# Patient Record
Sex: Female | Born: 1985 | Race: Black or African American | Hispanic: No | Marital: Single | Smoking: Never smoker
Health system: Southern US, Community
[De-identification: ages and names within clinical notes are randomized; demographics above are authoritative.]

## PROBLEM LIST (undated history)

## (undated) DIAGNOSIS — N6310 Unspecified lump in the right breast, unspecified quadrant: Secondary | ICD-10-CM

## (undated) DIAGNOSIS — B009 Herpesviral infection, unspecified: Secondary | ICD-10-CM

## (undated) DIAGNOSIS — Z800 Family history of malignant neoplasm of digestive organs: Secondary | ICD-10-CM

## (undated) DIAGNOSIS — Z8042 Family history of malignant neoplasm of prostate: Secondary | ICD-10-CM

## (undated) DIAGNOSIS — Z808 Family history of malignant neoplasm of other organs or systems: Secondary | ICD-10-CM

## (undated) DIAGNOSIS — D219 Benign neoplasm of connective and other soft tissue, unspecified: Secondary | ICD-10-CM

## (undated) DIAGNOSIS — K219 Gastro-esophageal reflux disease without esophagitis: Secondary | ICD-10-CM

## (undated) DIAGNOSIS — Z803 Family history of malignant neoplasm of breast: Secondary | ICD-10-CM

## (undated) HISTORY — DX: Family history of malignant neoplasm of digestive organs: Z80.0

## (undated) HISTORY — DX: Family history of malignant neoplasm of breast: Z80.3

## (undated) HISTORY — DX: Family history of malignant neoplasm of prostate: Z80.42

## (undated) HISTORY — DX: Benign neoplasm of connective and other soft tissue, unspecified: D21.9

## (undated) HISTORY — DX: Herpesviral infection, unspecified: B00.9

## (undated) HISTORY — PX: NO PAST SURGERIES: SHX2092

## (undated) HISTORY — DX: Family history of malignant neoplasm of other organs or systems: Z80.8

---

## 2012-01-26 DIAGNOSIS — R112 Nausea with vomiting, unspecified: Secondary | ICD-10-CM | POA: Insufficient documentation

## 2012-01-26 DIAGNOSIS — R109 Unspecified abdominal pain: Secondary | ICD-10-CM | POA: Insufficient documentation

## 2012-01-27 ENCOUNTER — Encounter (HOSPITAL_COMMUNITY): Payer: Self-pay | Admitting: *Deleted

## 2012-01-27 ENCOUNTER — Emergency Department (HOSPITAL_COMMUNITY)
Admission: EM | Admit: 2012-01-27 | Discharge: 2012-01-27 | Disposition: A | Discharge status: Home / Self Care | Attending: Emergency Medicine | Admitting: Emergency Medicine

## 2012-01-27 DIAGNOSIS — R112 Nausea with vomiting, unspecified: Secondary | ICD-10-CM

## 2012-01-27 LAB — URINALYSIS, ROUTINE W REFLEX MICROSCOPIC
Bilirubin Urine: NEGATIVE
Glucose, UA: NEGATIVE mg/dL
Hgb urine dipstick: NEGATIVE
Ketones, ur: NEGATIVE mg/dL
Protein, ur: NEGATIVE mg/dL

## 2012-01-27 MED ORDER — KETOROLAC TROMETHAMINE 60 MG/2ML IM SOLN: 60.0000 mg | Freq: Once | INTRAMUSCULAR | Status: DC

## 2012-01-27 MED ORDER — PROMETHAZINE HCL 25 MG PO TABS: 25.0000 mg | ORAL_TABLET | Freq: Four times a day (QID) | ORAL | Status: DC | PRN

## 2012-01-27 MED ORDER — IBUPROFEN 800 MG PO TABS
800.0000 mg | ORAL_TABLET | Freq: Once | ORAL | Status: AC
  Administered 2012-01-27: 800 mg via ORAL
  Filled 2012-01-27: qty 1

## 2012-01-27 MED ORDER — ONDANSETRON 4 MG PO TBDP
8.0000 mg | ORAL_TABLET | Freq: Once | ORAL | Status: AC
  Administered 2012-01-27: 8 mg via ORAL
  Filled 2012-01-27: qty 2

## 2012-01-27 MED ORDER — ONDANSETRON 4 MG PO TBDP: 4.0000 mg | ORAL_TABLET | Freq: Three times a day (TID) | ORAL | Status: DC | PRN

## 2012-01-27 NOTE — ED Notes (Signed)
C/o nv & abd cramping, onset ~ 1.5 hrs ago, onset after eating at a restaurant ([employer] [place] of [employer] [address]). No meds PTA. Denies sick contacts or recent illness. Denies other sx.

## 2012-01-27 NOTE — ED Notes (Signed)
Pt starting to feel better, pt denies wanting anything to eat or drink.

## 2012-01-27 NOTE — ED Provider Notes (Signed)
History     CSN: [id number]  Arrival date & time 01/26/12  2330   First MD Initiated Contact with Patient 01/27/12 0020      Chief Complaint  Patient presents with  . Abdominal Cramping  . Emesis    (Consider location/radiation/quality/duration/timing/severity/associated sxs/prior treatment) HPI Comments: 26 year old female who states that approximately 2 hours ago she developed acute onset of nausea vomiting and abdominal cramping. This is persistent, mild to moderate, not associated with fevers chills coughing shortness of breath dysuria diarrhea and states that she is not pregnant. This occurred approximately 2 hours after eating at a restaurant where she had a questionable meal. She states that the chicken Parmesan that she ate was not well cooked, lukewarm, and did not taste right.  Patient is a 26 y.o. female presenting with cramps and vomiting. The history is provided by the patient.  Abdominal Cramping The primary symptoms of the illness include abdominal pain, nausea and vomiting. The primary symptoms of the illness do not include fever or diarrhea.  Symptoms associated with the illness do not include chills.  Emesis  Associated symptoms include abdominal pain. Pertinent negatives include no chills, no diarrhea and no fever.    History reviewed. No pertinent past medical history.  History reviewed. No pertinent past surgical history.  No family history on file.  History  Substance Use Topics  . Smoking status: Never Smoker   . Smokeless tobacco: Not on file  . Alcohol Use: Yes    OB History    Grav Para Term Preterm Abortions TAB SAB Ect Mult Living                  Review of Systems  Constitutional: Negative for fever and chills.  Gastrointestinal: Positive for nausea, vomiting and abdominal pain. Negative for diarrhea.  Skin: Negative for rash.    Allergies  Review of patient's allergies indicates no known allergies.  Home Medications   Current  Outpatient Rx  Name Route Sig Dispense Refill  . ADULT MULTIVITAMIN W/MINERALS CH Oral Take 1 tablet by mouth daily.    Marland Kitchen ONDANSETRON 4 MG PO TBDP Oral Take 1 tablet (4 mg total) by mouth every 8 (eight) hours as needed for nausea. 10 tablet 0  . PROMETHAZINE HCL 25 MG PO TABS Oral Take 1 tablet (25 mg total) by mouth every 6 (six) hours as needed for nausea. 12 tablet 0    BP 118/70  Pulse 73  Temp 98.2 F (36.8 C) (Oral)  Resp 18  SpO2 99%  LMP 01/18/2012  Physical Exam  Nursing note and vitals reviewed. HENT:  Head: Normocephalic and atraumatic.  Mouth/Throat: Oropharynx is clear and moist.  Eyes: Conjunctivae normal are normal. No scleral icterus.  Cardiovascular: Normal rate, regular rhythm and normal heart sounds.   Pulmonary/Chest: Effort normal and breath sounds normal. No respiratory distress. She has no wheezes. She has no rales.  Abdominal: Soft. She exhibits no distension. There is no tenderness. There is no rebound.       Increased bowel sounds  Neurological: She is alert. Coordination normal.  Skin: Skin is warm and dry. No rash noted. No erythema.    ED Course  Procedures (including critical care time)  Labs Reviewed  URINALYSIS, ROUTINE W REFLEX MICROSCOPIC - Abnormal; Notable for the following:    Specific Gravity, Urine 1.004 (*)     All other components within normal limits  PREGNANCY, URINE   No results found.   1. Nausea and vomiting  MDM  Well-appearing, mild nausea and vomiting, will require Zofran, anti-inflammatory, rule out pregnancy and urinary infection. Possibly food borne illness, supportive care  Improved with Zofran, stable for discharge. Home with Phenergan and Zofran when necessary    Urinalysis negative for infection or pregnancy  Vida Roller, MD 01/27/12 0131

## 2014-01-28 ENCOUNTER — Other Ambulatory Visit: Payer: Self-pay | Admitting: Gynecology

## 2014-01-28 ENCOUNTER — Ambulatory Visit (INDEPENDENT_AMBULATORY_CARE_PROVIDER_SITE_OTHER)

## 2014-01-28 ENCOUNTER — Encounter: Payer: Self-pay | Admitting: Gynecology

## 2014-01-28 ENCOUNTER — Ambulatory Visit (INDEPENDENT_AMBULATORY_CARE_PROVIDER_SITE_OTHER): Admitting: Gynecology

## 2014-01-28 ENCOUNTER — Telehealth: Payer: Self-pay | Admitting: Gynecology

## 2014-01-28 VITALS — BP 124/80 | Ht 68.0 in | Wt 165.0 lb

## 2014-01-28 DIAGNOSIS — N83202 Unspecified ovarian cyst, left side: Secondary | ICD-10-CM

## 2014-01-28 DIAGNOSIS — R102 Pelvic and perineal pain: Secondary | ICD-10-CM

## 2014-01-28 DIAGNOSIS — N920 Excessive and frequent menstruation with regular cycle: Secondary | ICD-10-CM | POA: Insufficient documentation

## 2014-01-28 DIAGNOSIS — N831 Corpus luteum cyst of ovary, unspecified side: Secondary | ICD-10-CM

## 2014-01-28 DIAGNOSIS — N941 Dyspareunia: Secondary | ICD-10-CM

## 2014-01-28 DIAGNOSIS — N8320 Unspecified ovarian cysts: Secondary | ICD-10-CM

## 2014-01-28 DIAGNOSIS — Z30014 Encounter for initial prescription of intrauterine contraceptive device: Secondary | ICD-10-CM

## 2014-01-28 DIAGNOSIS — N946 Dysmenorrhea, unspecified: Secondary | ICD-10-CM

## 2014-01-28 DIAGNOSIS — IMO0002 Reserved for concepts with insufficient information to code with codable children: Secondary | ICD-10-CM

## 2014-01-28 MED ORDER — MEDROXYPROGESTERONE ACETATE 150 MG/ML IM SUSP
150.0000 mg | Freq: Once | INTRAMUSCULAR | Status: AC
  Administered 2014-01-28: 150 mg via INTRAMUSCULAR

## 2014-01-28 MED ORDER — DIAZEPAM 5 MG PO TABS: ORAL_TABLET | ORAL | Status: DC

## 2014-01-28 NOTE — Progress Notes (Addendum)
   28 year old gravida 1 para 0 AB 1 (one elective termination) presented to the office today to discuss intrauterine device for contraception cycle control. Patient suffers from heavy periods with passage of large clots although she menstruates every 28 days and last for approximately 5-7 days. She has tried numerous oral contraceptive pills as well as the NuvaRing Depo-Provera injection and is interested in the [place] IUD. Patient also has been complaining of dyspareunia. No postcoital bleeding reported. Patient stated that she had chlamydia infection which she was 28 years of age. She had a recent complete gynecological exam at [facility] 2 weeks ago and had a normal Pap smear. Patient is currently on day 2 of her menstrual cycle.  Exam: Abdomen: Soft nontender no rebound guarding Pelvic exam: And urethra Skene glands: Within normal limits Vagina: Menstrual blood present Cervix: Menstrual blood Bimanual exam difficult due to vaginismus Vaginal rectal exam: Not done as per above  We are in the process of doing an ultrasound as part of her evaluation for dyspareunia but patient had to leave.  Assessment/plan: #1 dysmenorrhea #2 dyspareunia #3 dysmenorrhea #4 information on the [facility] IUD pros and cons were discussed. She will return back to the office next week for placement of a Mirena IUD. I have given her prescription of Valium 5 mg to take the evening before placement of the [facility] IUD as well as 2 hours before the procedure the following day. I've asked also taken Motrin as well. Patient flu vaccine is up to date as well. We will schedule an ultrasound a month after placement of the [facility] IUD as well as to assess her ovaries as well. Literature information was provided.   Patient returned back in the afternoon so the ultrasound was performed results as follows: Uterus measured 8.5 x 5.3 x 3.8 cm with endometrial stripe of 10.1 mm. Anteverted uterus with normal right ovary. Left ovary  with a thin wall cyst measuring 3.1 x 2.4 x 2 20 mm with positive color flow in the periphery. No fluid in the cul-de-sac.  Because of cost she is going to hold off on the [place] IUD. She will be given a shot of Depo-Provera 150 mg IM today and she will return back in 3 months for a followup ultrasound and then reschedule the [facility]  IUD for later date. A CA 125 was done today. Patient fully aware of its limitations.

## 2014-01-28 NOTE — Patient Instructions (Signed)
Levonorgestrel intrauterine device (IUD) What is this medicine? LEVONORGESTREL IUD (LEE voe nor jes trel) is a contraceptive (birth control) device. The device is placed inside the uterus by a healthcare professional. It is used to prevent pregnancy and can also be used to treat heavy bleeding that occurs during your period. Depending on the device, it can be used for 3 to 5 years. This medicine may be used for other purposes; ask your health care provider or pharmacist if you have questions. COMMON BRAND NAME(S): LILETTA, Mirena, Skyla What should I tell my health care provider before I take this medicine? They need to know if you have any of these conditions: -abnormal Pap smear -cancer of the breast, uterus, or cervix -diabetes -endometritis -genital or pelvic infection now or in the past -have more than one sexual partner or your partner has more than one partner -heart disease -history of an ectopic or tubal pregnancy -immune system problems -IUD in place -liver disease or tumor -problems with blood clots or take blood-thinners -use intravenous drugs -uterus of unusual shape -vaginal bleeding that has not been explained -an unusual or allergic reaction to levonorgestrel, other hormones, silicone, or polyethylene, medicines, foods, dyes, or preservatives -pregnant or trying to get pregnant -breast-feeding How should I use this medicine? This device is placed inside the uterus by a health care professional. Talk to your pediatrician regarding the use of this medicine in children. Special care may be needed. Overdosage: If you think you have taken too much of this medicine contact a poison control center or emergency room at once. NOTE: This medicine is only for you. Do not share this medicine with others. What if I miss a dose? This does not apply. What may interact with this medicine? Do not take this medicine with any of the following  medications: -amprenavir -bosentan -fosamprenavir This medicine may also interact with the following medications: -aprepitant -barbiturate medicines for inducing sleep or treating seizures -bexarotene -griseofulvin -medicines to treat seizures like carbamazepine, ethotoin, felbamate, oxcarbazepine, phenytoin, topiramate -modafinil -pioglitazone -rifabutin -rifampin -rifapentine -some medicines to treat HIV infection like atazanavir, indinavir, lopinavir, nelfinavir, tipranavir, ritonavir -St. John's wort -warfarin This list may not describe all possible interactions. Give your health care provider a list of all the medicines, herbs, non-prescription drugs, or dietary supplements you use. Also tell them if you smoke, drink alcohol, or use illegal drugs. Some items may interact with your medicine. What should I watch for while using this medicine? Visit your doctor or health care professional for regular check ups. See your doctor if you or your partner has sexual contact with others, becomes HIV positive, or gets a sexual transmitted disease. This product does not protect you against HIV infection (AIDS) or other sexually transmitted diseases. You can check the placement of the IUD yourself by reaching up to the top of your vagina with clean fingers to feel the threads. Do not pull on the threads. It is a good habit to check placement after each menstrual period. Call your doctor right away if you feel more of the IUD than just the threads or if you cannot feel the threads at all. The IUD may come out by itself. You may become pregnant if the device comes out. If you notice that the IUD has come out use a backup birth control method like condoms and call your health care provider. Using tampons will not change the position of the IUD and are okay to use during your period. What side effects may   I notice from receiving this medicine? Side effects that you should report to your doctor or  health care professional as soon as possible: -allergic reactions like skin rash, itching or hives, swelling of the face, lips, or tongue -fever, flu-like symptoms -genital sores -high blood pressure -no menstrual period for 6 weeks during use -pain, swelling, warmth in the leg -pelvic pain or tenderness -severe or sudden headache -signs of pregnancy -stomach cramping -sudden shortness of breath -trouble with balance, talking, or walking -unusual vaginal bleeding, discharge -yellowing of the eyes or skin Side effects that usually do not require medical attention (report to your doctor or health care professional if they continue or are bothersome): -acne -breast pain -change in sex drive or performance -changes in weight -cramping, dizziness, or faintness while the device is being inserted -headache -irregular menstrual bleeding within first 3 to 6 months of use -nausea This list may not describe all possible side effects. Call your doctor for medical advice about side effects. You may report side effects to FDA at 1-800-FDA-1088. Where should I keep my medicine? This does not apply. NOTE: This sheet is a summary. It may not cover all possible information. If you have questions about this medicine, talk to your doctor, pharmacist, or health care provider.  2015, Elsevier/Gold Standard. (2011-05-11 13:54:04)  

## 2014-01-28 NOTE — Addendum Note (Signed)
Addended by: Thurnell Garbe A on: 01/28/2014 04:54 PM   Modules accepted: Orders

## 2014-01-28 NOTE — Telephone Encounter (Signed)
01/28/14-PT HAS [employer] AND BECAUSE WE ARE NOT IN NETWORK  WITH THEM THE PATIENT WAS INFORMED THAT SHE HAS A 20% COINSURANCE ON THE MIRENA AND INSERTION OF $[phone].WL

## 2014-01-28 NOTE — Addendum Note (Signed)
Addended by: Terrance Mass on: 01/28/2014 04:04 PM   Modules accepted: Orders

## 2014-01-29 LAB — CA 125: CA 125: 21 U/mL (ref <35)

## 2014-02-23 ENCOUNTER — Encounter: Payer: Self-pay | Admitting: Gynecology

## 2014-03-09 ENCOUNTER — Ambulatory Visit: Admitting: Gynecology

## 2014-03-27 ENCOUNTER — Telehealth: Payer: Self-pay | Admitting: *Deleted

## 2014-03-27 NOTE — Telephone Encounter (Signed)
(  pt aware you are out of the office) Pt calling to follow up from [place] 01/28/2014 received depo provera shot c/o spotting everyday since having shot. Pt said bleeding is not heavy wearing tampon changing ever 5 hour, more nerve racking than anything. Pt asked if you could give her something to stop the spotting? Please advise

## 2014-03-29 ENCOUNTER — Other Ambulatory Visit: Payer: Self-pay | Admitting: Gynecology

## 2014-03-29 NOTE — Telephone Encounter (Signed)
Please call in prescription for Estrace 0.5 mg po q daily for 25 days call in 25 tablets. Also she can take Motrin 800mg  TID with foods for five days as well. Make sure she does a home UPT as well to make sure she is not one of the 1 % that can get pregnant on Depoprovera.

## 2014-03-30 MED ORDER — ESTRADIOL 0.5 MG PO TABS: 0.5000 mg | ORAL_TABLET | Freq: Every day | ORAL | Status: DC

## 2014-03-30 MED ORDER — IBUPROFEN 800 MG PO TABS
ORAL_TABLET | ORAL | Status: DC
Start: 2014-03-30 — End: 2017-04-10

## 2014-03-30 NOTE — Telephone Encounter (Signed)
Pt informed with the below note, Rx sent. 

## 2014-04-22 ENCOUNTER — Ambulatory Visit (INDEPENDENT_AMBULATORY_CARE_PROVIDER_SITE_OTHER): Admitting: Gynecology

## 2014-04-22 ENCOUNTER — Encounter: Payer: Self-pay | Admitting: Gynecology

## 2014-04-22 VITALS — BP 118/74

## 2014-04-22 DIAGNOSIS — N83202 Unspecified ovarian cyst, left side: Secondary | ICD-10-CM

## 2014-04-22 DIAGNOSIS — N938 Other specified abnormal uterine and vaginal bleeding: Secondary | ICD-10-CM

## 2014-04-22 DIAGNOSIS — N8320 Unspecified ovarian cysts: Secondary | ICD-10-CM

## 2014-04-22 MED ORDER — NORETHINDRONE-ETH ESTRADIOL 1-35 MG-MCG PO TABS: ORAL_TABLET | ORAL | Status: DC

## 2014-04-22 NOTE — Progress Notes (Signed)
   Patient presented to the office today for planned placement of Mirena IUD. Patient now has had a change of heart and would like to go back on the oral contraceptive pill like she had been in the past in which she had regular cycles. On the last office visit of October 7 because of her dyspareunia and dysmenorrhea we proceeded with doing an ultrasound and the following was noted:  Uterus measured 8.5 x 5.3 x 3.8 cm with endometrial stripe of 10.1 mm. Anteverted uterus with normal right ovary. Left ovary with a thin wall cyst measuring 3.1 x 2.4 x 2 20 mm with positive color flow in the periphery. No fluid in the cul-de-sac.   A CA-125 was in the normal range and she was given a shot of Depo-Provera 150 mg IM and is scheduled to return back next month for follow-up ultrasound on the ovarian cyst. She states that since she had the shot she's been bleeding just about every day.  We are going to prescribe her Ortho-Novum 1/35 her to take continuously and withdrawal every 3 months. We will see her next month for follow-up ultrasound. The risks benefits and pros and cons of oral contraceptive pills were discussed.

## 2014-06-17 ENCOUNTER — Telehealth: Payer: Self-pay | Admitting: *Deleted

## 2014-06-17 DIAGNOSIS — N83202 Unspecified ovarian cyst, left side: Secondary | ICD-10-CM

## 2014-06-17 NOTE — Telephone Encounter (Signed)
-----   Message from Sinclair Grooms sent at 06/17/2014 12:16 PM EST ----- Regarding: [place] order "We will see her next month for follow-up ultrasound." per JF  note dated 04-22-14. Please place order. Thx

## 2014-06-17 NOTE — Telephone Encounter (Signed)
Order placed

## 2014-07-03 ENCOUNTER — Other Ambulatory Visit

## 2014-07-03 ENCOUNTER — Ambulatory Visit: Admitting: Gynecology

## 2014-07-10 ENCOUNTER — Ambulatory Visit (INDEPENDENT_AMBULATORY_CARE_PROVIDER_SITE_OTHER)

## 2014-07-10 ENCOUNTER — Encounter: Payer: Self-pay | Admitting: Gynecology

## 2014-07-10 ENCOUNTER — Other Ambulatory Visit: Payer: Self-pay | Admitting: Gynecology

## 2014-07-10 ENCOUNTER — Ambulatory Visit (INDEPENDENT_AMBULATORY_CARE_PROVIDER_SITE_OTHER): Admitting: Gynecology

## 2014-07-10 DIAGNOSIS — Z8041 Family history of malignant neoplasm of ovary: Secondary | ICD-10-CM

## 2014-07-10 DIAGNOSIS — N946 Dysmenorrhea, unspecified: Secondary | ICD-10-CM | POA: Diagnosis not present

## 2014-07-10 DIAGNOSIS — N83202 Unspecified ovarian cyst, left side: Secondary | ICD-10-CM

## 2014-07-10 DIAGNOSIS — Z803 Family history of malignant neoplasm of breast: Secondary | ICD-10-CM | POA: Diagnosis not present

## 2014-07-10 DIAGNOSIS — N8320 Unspecified ovarian cysts: Secondary | ICD-10-CM | POA: Diagnosis not present

## 2014-07-10 DIAGNOSIS — Z800 Family history of malignant neoplasm of digestive organs: Secondary | ICD-10-CM | POA: Insufficient documentation

## 2014-07-10 NOTE — Progress Notes (Signed)
    Patient is a 29 year old who was seen last in the office on 04/22/2014 who had been scheduled originally to plan in place a Mirena IUD.Patient now has had a change of heart and would like to go back on the oral contraceptive pill like she had been in the past in which she had regular cycles. On the last office visit of October 7 because of her dyspareunia and dysmenorrhea we proceeded with doing an ultrasound and the following was noted:  Uterus measured 8.5 x 5.3 x 3.8 cm with endometrial stripe of 10.1 mm. Anteverted uterus with normal right ovary. Left ovary with a thin wall cyst measuring 3.1 x 2.4 x 2 20 mm with positive color flow in the periphery. No fluid in the cul-de-sac.   A CA-125 was in the normal range and she was given a shot of Depo-Provera 150 mg IM and is scheduled to return back next month for follow-up ultrasound on the ovarian cyst. Her CA-125 was normal. She recently started her oral contraceptive pills and is now having normal menstrual cycles. She is here for follow-up to discuss the ultrasound.  Ultrasound today: Uterus measures 7.4 x 5.1 x 3.9 cm with endometrial stripe of 9.8 mm. Right and left ovary were normal. No fluid in the cul-de-sac and no apparent adnexal masses  Patient has informed me of significant number of relatives with cancers as follows:: Mother with breast cancer and age 54 Father with colon cancer at the age of 69 Maternal grandmother with lung cancer Maternal aunt with breast cancer Maternal cousin with ovarian cancer  Patient will be referred to the [facility] with the geneticist to discuss further cancer screening with genetic markers. I have asked her to discuss with her family members to get more detail in the event that other family members have other forms of cancer and to bring that information with her to the consultation as a prepared to do a pedigree a family history of various cancers for consideration of genetic  testing. She was reminded to schedule her annual exam the next 2 months.

## 2014-07-10 NOTE — Patient Instructions (Signed)
BRCA-1 and BRCA-2 BRCA-1 and BRCA-2 are 2 genes that are linked with hereditary breast and ovarian cancers. About 200,000 women are diagnosed with invasive breast cancer each year and about 23,000 with ovarian cancer (according to the American Cancer Society). Of these cancers, about 5% to 10% will be due to a mutation in one of the BRCA genes. Men can also inherit an increased risk of developing breast cancer, primarily from an alteration in the BRCA-2 gene.  Individuals with mutations in BRCA1 or BRCA2 have significantly elevated risks for breast cancer (up to 80% lifetime risk), ovarian cancer (up to 40% lifetime risk), bilateral breast cancer and other types of cancers. BRCA mutations are inherited and passed from generation to generation. One half of the time, they are passed from the father's side of the family.  The DNA in white blood cells is used to detect mutations in the BRCA genes. While the gene products (proteins) of the BRCA genes act only in breast and ovarian tissue, the genes are present in every cell of the body and blood is the most easily accessible source of that DNA. PREPARATION FOR TEST The test for BRCA mutations is done on a blood sample collected by needle from a vein in the arm. The test does not require surgical biopsy of breast or ovarian tissue.  NORMAL FINDINGS No genetic mutations. Ranges for normal findings may vary among different laboratories and hospitals. You should always check with your doctor after having lab work or other tests done to discuss the meaning of your test results and whether your values are considered within normal limits. MEANING OF TEST  Your caregiver will go over the test results with you and discuss the importance and meaning of your results, as well as treatment options and the need for additional tests if necessary. OBTAINING THE TEST RESULTS It is your responsibility to obtain your test results. Ask the lab or department performing the test  when and how you will get your results. OTHER THINGS TO KNOW Your test results may have implications for other family members. When one member of a family is tested for BRCA mutations, issues often arise about how or whether to share this information with other family members. Seek advice from a genetic counselor about communication of result with your family members.  Pre and post test consultation with a health care provider knowledgeable about genetic testing cannot be overemphasized.  There are many issues to be considered when preparing for a genetic test and upon learning the results, and a genetic counselor has the knowledge and experience to help you sort through them.  If the BRCA test is positive, the options include increased frequency of check-ups (e.g., mammography, blood tests for CA-125, or transvaginal ultrasonography); medications that could reduce risk (e.g., oral contraceptives or tamoxifen); or surgical removal of the ovaries or breasts. There are a number of variables involved and it is important to discuss your options with your doctor and genetic counselor. Research studies have reported that for every 1000 women negative for BRCA mutations, between 12 and 45 of them will develop breast cancer by age 50 and between 3 and 4 will develop ovarian cancer by age 50. The risk increases with age. The test can be ordered by a doctor, preferably by one who can also offer genetic counseling. The blood sample will be sent to a laboratory that specializes in BRCA testing. The American Society of Clinical Oncology and the National Breast Cancer Coalition encourage women seeking the   test to participate in long-term outcome studies to help gather information on the effectiveness of different check-up and treatment options. Document Released: 05/04/2004 Document Revised: 07/03/2011 Document Reviewed: 07/11/2013 [employer] Patient Information 2015 [place], [place]. This information is not intended to  replace advice given to you by your health care provider. Make sure you discuss any questions you have with your health care provider.

## 2014-07-13 ENCOUNTER — Telehealth: Payer: Self-pay | Admitting: *Deleted

## 2014-07-13 ENCOUNTER — Telehealth: Payer: Self-pay | Admitting: Genetic Counselor

## 2014-07-13 NOTE — Telephone Encounter (Signed)
Called pt and scheduled new pt gen couseling appt.   Cheryl [name] 08/03/14  2 pm Dx: Family History of Breast Cancer Referring: Dr. Toney Rakes

## 2014-07-13 NOTE — Telephone Encounter (Signed)
-----   Message from Terrance Mass, MD sent at 07/10/2014  3:10 PM EDT ----- Anderson Malta, please schedule appointment for this patient with geneticist at the [facility] [place].  Patient will be referred to the [facility] with the geneticist to discuss further cancer screening with genetic markers. I have asked her to discuss with her family members to get more detail in the event that other family members have other forms of cancer and to bring that information with her to the consultation as a prepared to do a pedigree a family history of various cancers for consideration of genetic testing.   Mother with history of breast and ovarian cancer age 41 Father history of colon cancer at the age of 53 Maternal grandmother with history of lung cancer Maternal aunts with history of breast cancer Maternal cousin with history of ovarian cancer

## 2014-07-13 NOTE — Telephone Encounter (Signed)
Referral faxed to [facility] they will contact pt to schedule.

## 2014-07-16 NOTE — Telephone Encounter (Signed)
Appointment 08/03/14 @ 4:00pm

## 2014-08-03 ENCOUNTER — Encounter: Admitting: Genetic Counselor

## 2014-08-03 ENCOUNTER — Other Ambulatory Visit

## 2014-09-13 ENCOUNTER — Emergency Department (HOSPITAL_BASED_OUTPATIENT_CLINIC_OR_DEPARTMENT_OTHER)
Admission: EM | Admit: 2014-09-13 | Discharge: 2014-09-13 | Disposition: A | Discharge status: Home / Self Care | Attending: Emergency Medicine | Admitting: Emergency Medicine

## 2014-09-13 ENCOUNTER — Encounter (HOSPITAL_BASED_OUTPATIENT_CLINIC_OR_DEPARTMENT_OTHER): Payer: Self-pay | Admitting: *Deleted

## 2014-09-13 ENCOUNTER — Emergency Department (HOSPITAL_BASED_OUTPATIENT_CLINIC_OR_DEPARTMENT_OTHER)

## 2014-09-13 DIAGNOSIS — Z8619 Personal history of other infectious and parasitic diseases: Secondary | ICD-10-CM | POA: Diagnosis not present

## 2014-09-13 DIAGNOSIS — Y9367 Activity, basketball: Secondary | ICD-10-CM | POA: Insufficient documentation

## 2014-09-13 DIAGNOSIS — X58XXXA Exposure to other specified factors, initial encounter: Secondary | ICD-10-CM | POA: Insufficient documentation

## 2014-09-13 DIAGNOSIS — S93402A Sprain of unspecified ligament of left ankle, initial encounter: Secondary | ICD-10-CM | POA: Insufficient documentation

## 2014-09-13 DIAGNOSIS — Y9289 Other specified places as the place of occurrence of the external cause: Secondary | ICD-10-CM | POA: Diagnosis not present

## 2014-09-13 DIAGNOSIS — Y998 Other external cause status: Secondary | ICD-10-CM | POA: Diagnosis not present

## 2014-09-13 DIAGNOSIS — S99912A Unspecified injury of left ankle, initial encounter: Secondary | ICD-10-CM | POA: Diagnosis present

## 2014-09-13 DIAGNOSIS — Z79899 Other long term (current) drug therapy: Secondary | ICD-10-CM | POA: Insufficient documentation

## 2014-09-13 MED ORDER — METHOCARBAMOL 500 MG PO TABS: 1000.0000 mg | ORAL_TABLET | Freq: Four times a day (QID) | ORAL | Status: DC | PRN

## 2014-09-13 NOTE — ED Provider Notes (Signed)
CSN: [id number]     Arrival date & time 09/13/14  1812 History   First MD Initiated Contact with Patient 09/13/14 1828     Chief Complaint  Patient presents with  . Ankle Injury     (Consider location/radiation/quality/duration/timing/severity/associated sxs/prior Treatment) HPI   Cheryl [name] is a 29 y.o. female complaining of Left ankle pain after patient rolled ankle while playing basketball prior to arrival. Patient is unable to weight-bear on the ankle. She reports initially her pain was about 8 out of 10, since resting it, icing it and taking 3 over-the-counter Advil pills for pain is now 3 out of 10. Patient denies numbness, weakness, laceration, history of trauma or surgery to the affected joint. No other trauma in the incident.  Past Medical History  Diagnosis Date  . HSV-2 infection    History reviewed. No pertinent past surgical history. Family History  Problem Relation Age of Onset  . Adopted: Yes  . Breast cancer Mother   . Ovarian cancer Mother   . Cancer Maternal Aunt     brain   . Cancer - Lung Maternal Grandmother     lung  . Cancer - Ovarian Cousin     ovarian-maternal  . Cancer - Colon Father    History  Substance Use Topics  . Smoking status: Never Smoker   . Smokeless tobacco: Never Used  . Alcohol Use: No   OB History    Gravida Para Term Preterm AB TAB SAB Ectopic Multiple Living   1 0 0 0 1 1 0 0 0 0      Review of Systems  10 systems reviewed and found to be negative, except as noted in the HPI.   Allergies  Review of patient's allergies indicates no known allergies.  Home Medications   Prior to Admission medications   Medication Sig Start Date End Date Taking? Authorizing Provider  ibuprofen (ADVIL,MOTRIN) 800 MG tablet Take one tablet three times a day with food for five days PRN  For pain. 03/30/14  Yes Terrance Mass, MD  Multiple Vitamin (MULTIVITAMIN WITH MINERALS) TABS Take 1 tablet by mouth daily.   Yes Historical Provider,  MD  NASAL SPRAY SALINE NA Place into the nose.   Yes Historical Provider, MD  norethindrone-ethinyl estradiol 1/35 (Prue 1/35, 28,) tablet Take one daily as directed and withdraw every three months. 04/22/14  Yes Terrance Mass, MD  valACYclovir (VALTREX) 500 MG tablet Take 500 mg by mouth 2 (two) times daily.   Yes Historical Provider, MD  diazepam (VALIUM) 5 MG tablet Take one night before procedure and one 2 hours before procedure 01/28/14   Terrance Mass, MD  methocarbamol (ROBAXIN) 500 MG tablet Take 2 tablets (1,000 mg total) by mouth 4 (four) times daily as needed (Pain). 09/13/14   Logyn Dedominicis, PA-C  ondansetron (ZOFRAN ODT) 4 MG disintegrating tablet Take 1 tablet (4 mg total) by mouth every 8 (eight) hours as needed for nausea. Patient not taking: Reported on 04/22/2014 01/27/12   Noemi Chapel, MD  promethazine (PHENERGAN) 25 MG tablet Take 1 tablet (25 mg total) by mouth every 6 (six) hours as needed for nausea. Patient not taking: Reported on 04/22/2014 01/27/12   Noemi Chapel, MD   BP 110/71 mmHg  Pulse 96  Temp(Src) 98.3 F (36.8 C) (Oral)  Resp 18  Ht 5\' 9"  (1.753 m)  Wt 162 lb (73.483 kg)  BMI 23.91 kg/m2  SpO2 98%  LMP 09/02/2014 Physical Exam  Constitutional: She is  oriented to person, place, and time. She appears well-developed and well-nourished. No distress.  HENT:  Head: Normocephalic.  Eyes: Conjunctivae and EOM are normal.  Cardiovascular: Normal rate.   Pulmonary/Chest: Effort normal. No stridor.  Musculoskeletal: Normal range of motion. She exhibits tenderness.  Left ankle:  No deformity, no overlying skin changes, mild swelling. + tenderness along the inferior, lateral malleolus, distally neurovascularly intact.   Neurological: She is alert and oriented to person, place, and time.  Psychiatric: She has a normal mood and affect.  Nursing note and vitals reviewed.   ED Course  Procedures (including critical care time) Labs Review Labs  Reviewed - No data to display  Imaging Review Dg Ankle Complete Right  09/13/2014   CLINICAL DATA:  Twisting injury while playing basketball [place] afternoon, initial encounter  EXAM: RIGHT ANKLE - COMPLETE 3+ VIEW  COMPARISON:  None.  FINDINGS: There is no evidence of fracture, dislocation, or joint effusion. There is no evidence of arthropathy or other focal bone abnormality. Soft tissues are unremarkable.  IMPRESSION: No acute abnormality noted.   Electronically Signed   By: Inez Catalina M.D.   On: 09/13/2014 19:16     EKG Interpretation None      MDM   Final diagnoses:  Left ankle sprain, initial encounter    Filed Vitals:   09/13/14 1816 09/13/14 2022  BP: 125/71 110/71  Pulse: 60 96  Temp: 98.3 F (36.8 C)   TempSrc: Oral   Resp: 18 18  Height: 5\' 9"  (1.753 m)   Weight: 162 lb (73.483 kg)   SpO2: 100% 98%   Cheryl [name] is a pleasant 29 y.o. female presenting with left ankle pain after she rolled her ankle while playing basketball earlier this afternoon.Neurovascularly intact with mild tenderness to palpation. X-rays negative. Will treat with rest, ice, compression elevation, NSAIDS. Patient given crutches and an orthopedic follow-up.   Evaluation does not show pathology that would require ongoing emergent intervention or inpatient treatment. Pt is hemodynamically stable and mentating appropriately. Discussed findings and plan with patient/guardian, who agrees with care plan. All questions answered. Return precautions discussed and outpatient follow up given.   Discharge Medication List as of 09/13/2014  7:57 PM    START taking these medications   Details  methocarbamol (ROBAXIN) 500 MG tablet Take 2 tablets (1,000 mg total) by mouth 4 (four) times daily as needed (Pain)., Starting 09/13/2014, Until Discontinued, Print             Monico Blitz, PA-C 09/13/14 2100  Ripley Fraise, MD 09/13/14 2112

## 2014-09-13 NOTE — Discharge Instructions (Signed)
For pain control you may take up to 800mg  of Motrin (also known as ibuprofen). That is usually 4 over the counter pills,  3 times a day. Take with food to minimize stomach irritation   You can also take  tylenol (acetaminophen) 975mg  (this is 3 over the counter pills) four times a day. Do not drink alcohol or combine with other medications that have acetaminophen as an ingredient (Read the labels!).    For breakthrough pain you may take Robaxin. Do not drink alcohol, drive or operate heavy machinery when taking Robaxin.   Ankle Sprain An ankle sprain is an injury to the strong, fibrous tissues (ligaments) that hold the bones of your ankle joint together.  CAUSES An ankle sprain is usually caused by a fall or by twisting your ankle. Ankle sprains most commonly occur when you step on the outer edge of your foot, and your ankle turns inward. People who participate in sports are more prone to these types of injuries.  SYMPTOMS   Pain in your ankle. The pain may be present at rest or only when you are trying to stand or walk.  Swelling.  Bruising. Bruising may develop immediately or within 1 to 2 days after your injury.  Difficulty standing or walking, particularly when turning corners or changing directions. DIAGNOSIS  Your caregiver will ask you details about your injury and perform a physical exam of your ankle to determine if you have an ankle sprain. During the physical exam, your caregiver will press on and apply pressure to specific areas of your foot and ankle. Your caregiver will try to move your ankle in certain ways. An X-ray exam may be done to be sure a bone was not broken or a ligament did not separate from one of the bones in your ankle (avulsion fracture).  TREATMENT  Certain types of braces can help stabilize your ankle. Your caregiver can make a recommendation for this. Your caregiver may recommend the use of medicine for pain. If your sprain is severe, your caregiver may refer  you to a surgeon who helps to restore function to parts of your skeletal system (orthopedist) or a physical therapist. [place] ice to your injury for 1-2 days or as directed by your caregiver. Applying ice helps to reduce inflammation and pain.  Put ice in a plastic bag.  Place a towel between your skin and the bag.  Leave the ice on for 15-20 minutes at a time, every 2 hours while you are awake.  Only take over-the-counter or prescription medicines for pain, discomfort, or fever as directed by your caregiver.  Elevate your injured ankle above the level of your heart as much as possible for 2-3 days.  If your caregiver recommends crutches, use them as instructed. Gradually put weight on the affected ankle. Continue to use crutches or a cane until you can walk without feeling pain in your ankle.  If you have a plaster splint, wear the splint as directed by your caregiver. Do not rest it on anything harder than a pillow for the first 24 hours. Do not put weight on it. Do not get it wet. You may take it off to take a shower or bath.  You may have been given an elastic bandage to wear around your ankle to provide support. If the elastic bandage is too tight (you have numbness or tingling in your foot or your foot becomes cold and blue), adjust the bandage to make it  comfortable.  If you have an air splint, you may blow more air into it or let air out to make it more comfortable. You may take your splint off at night and before taking a shower or bath. Wiggle your toes in the splint several times per day to decrease swelling. SEEK MEDICAL CARE IF:   You have rapidly increasing bruising or swelling.  Your toes feel extremely cold or you lose feeling in your foot.  Your pain is not relieved with medicine. SEEK IMMEDIATE MEDICAL CARE IF:  Your toes are numb or blue.  You have severe pain that is increasing. MAKE SURE YOU:   Understand these instructions.  Will  watch your condition.  Will get help right away if you are not doing well or get worse. Document Released: 04/10/2005 Document Revised: 01/03/2012 Document Reviewed: 04/22/2011 [facility] Patient Information 2015 [place], [place]. This information is not intended to replace advice given to you by your health care provider. Make sure you discuss any questions you have with your health care provider.

## 2014-09-13 NOTE — ED Notes (Signed)
Pt reports she rolled right ankle while playing basketball today- has not removed her boot since injury- states unable to bear weight

## 2014-09-16 NOTE — ED Notes (Signed)
Patient called requesting a school note with restricted activities.  Chart reviewed with Dr. Ralene Bathe.  Noted given for activities as tolerated, use ankle splint and crutches when ambulating for the next seven days.  Follow up with Dr. Barbaraann Barthel if pain persist.

## 2015-01-28 ENCOUNTER — Encounter: Admitting: Gynecology

## 2015-02-04 ENCOUNTER — Encounter: Admitting: Gynecology

## 2015-03-01 ENCOUNTER — Encounter: Admitting: Gynecology

## 2015-03-29 ENCOUNTER — Encounter: Admitting: Gynecology

## 2015-04-09 ENCOUNTER — Other Ambulatory Visit (HOSPITAL_COMMUNITY)
Admission: RE | Admit: 2015-04-09 | Discharge: 2015-04-09 | Disposition: A | Discharge status: Home / Self Care | Source: Ambulatory Visit | Attending: Gynecology | Admitting: Gynecology

## 2015-04-09 ENCOUNTER — Ambulatory Visit (INDEPENDENT_AMBULATORY_CARE_PROVIDER_SITE_OTHER): Admitting: Gynecology

## 2015-04-09 ENCOUNTER — Encounter: Payer: Self-pay | Admitting: Gynecology

## 2015-04-09 ENCOUNTER — Telehealth: Payer: Self-pay | Admitting: *Deleted

## 2015-04-09 VITALS — BP 118/76 | Ht 68.0 in | Wt 160.0 lb

## 2015-04-09 DIAGNOSIS — N921 Excessive and frequent menstruation with irregular cycle: Secondary | ICD-10-CM | POA: Diagnosis not present

## 2015-04-09 DIAGNOSIS — Z01419 Encounter for gynecological examination (general) (routine) without abnormal findings: Secondary | ICD-10-CM

## 2015-04-09 DIAGNOSIS — Z01411 Encounter for gynecological examination (general) (routine) with abnormal findings: Secondary | ICD-10-CM | POA: Diagnosis not present

## 2015-04-09 DIAGNOSIS — Z800 Family history of malignant neoplasm of digestive organs: Secondary | ICD-10-CM

## 2015-04-09 DIAGNOSIS — Z8041 Family history of malignant neoplasm of ovary: Secondary | ICD-10-CM

## 2015-04-09 DIAGNOSIS — Z803 Family history of malignant neoplasm of breast: Secondary | ICD-10-CM

## 2015-04-09 MED ORDER — LEVONORGESTREL-ETHINYL ESTRAD 0.1-20 MG-MCG PO TABS: 1.0000 | ORAL_TABLET | Freq: Every day | ORAL | Status: DC

## 2015-04-09 NOTE — Telephone Encounter (Signed)
Referral placed they will contact pt to scheduled.

## 2015-04-09 NOTE — Telephone Encounter (Signed)
-----   Message from Terrance Mass, MD sent at 04/09/2015 12:23 PM EST ----- Anderson Malta please make an appointment for this patient with genetic counselor at [facility]. Family history of multiple cancers

## 2015-04-09 NOTE — Progress Notes (Signed)
Cheryl [name] [birth year] [id number]   History:    29 y.o.  for annual gyn exam  With the only complaint of breakthrough bleeding on her 35 g oral contraceptive pill. She has had good compliance. Patient was seen the office in March of this year for follow-up ultrasound. History as follows:   January 28 2014 because of dyspareunia and dysmenorrhea an ultrasound was done which demonstrated the following:  Uterus measured 8.5 x 5.3 x 3.8 cm with endometrial stripe of 10.1 mm. Anteverted uterus with normal right ovary. Left ovary with a thin wall cyst measuring 3.1 x 2.4 x 2 20 mm with positive color flow in the periphery. No fluid in the cul-de-sac.\ A  CA 125 was obtained which was normal. She was given a shot of Depo-Provera 150 mg IM and return to the office on March of this year for follow-up ultrasound and the following was reported:  Uterus measures 7.4 x 5.1 x 3.9 cm with endometrial stripe of 9.8 mm. Right and left ovary were normal. No fluid in the cul-de-sac and no apparent adnexal masses  it was after this visit that she was started on the 35 g oral contraceptive pill. Patient completed her HPV vaccine at a young age. Patient in a monogamous relationship.  Patient has informed me of significant number of relatives with cancers as follows:: Mother with breast cancer and age 78 Father with colon cancer at the age of 26 Maternal grandmother with lung cancer Maternal aunt with breast cancer Maternal cousin with ovarian cancer   she had been offered an appointment with the regional [place] geneticist for counseling but due to school was not able to break away for the appointment and would like to have that scheduled.  Past medical history,surgical history, family history and social history were all reviewed and documented in the EPIC chart.  Gynecologic History Patient's last menstrual period was 03/19/2015. Contraception: OCP (estrogen/progesterone) Last Pap:  Several  years ago. Results were:  Patient reports normal we do not have records Last mammogram:  Not indicated. Results were:  Not indicated  Obstetric History OB History  Gravida Para Term Preterm AB SAB TAB Ectopic Multiple Living  1 0 0 0 1 0 1 0 0 0     # Outcome Date GA Lbr Len/2nd Weight Sex Delivery Anes PTL Lv  1 TAB                ROS: A ROS was performed and pertinent positives and negatives are included in the history.  GENERAL: No fevers or chills. HEENT: No change in vision, no earache, sore throat or sinus congestion. NECK: No pain or stiffness. CARDIOVASCULAR: No chest pain or pressure. No palpitations. PULMONARY: No shortness of breath, cough or wheeze. GASTROINTESTINAL: No abdominal pain, nausea, vomiting or diarrhea, melena or bright red blood per rectum. GENITOURINARY: No urinary frequency, urgency, hesitancy or dysuria. MUSCULOSKELETAL: No joint or muscle pain, no back pain, no recent trauma. DERMATOLOGIC: No rash, no itching, no lesions. ENDOCRINE: No polyuria, polydipsia, no heat or cold intolerance. No recent change in weight. HEMATOLOGICAL: No anemia or easy bruising or bleeding. NEUROLOGIC: No headache, seizures, numbness, tingling or weakness. PSYCHIATRIC: No depression, no loss of interest in normal activity or change in sleep pattern.     Exam: chaperone present  BP 118/76 mmHg  Ht 5' 8"  (1.727 m)  Wt 160 lb (72.576 kg)  BMI 24.33 kg/m2  LMP 03/19/2015  Body mass index is 24.33 kg/(m^2).  General  appearance : Well developed well nourished female. No acute distress HEENT: Eyes: no retinal hemorrhage or exudates,  Neck supple, trachea midline, no carotid bruits, no thyroidmegaly Lungs: Clear to auscultation, no rhonchi or wheezes, or rib retractions  Heart: Regular rate and rhythm, no murmurs or gallops Breast:Examined in sitting and supine position were symmetrical in appearance, no palpable masses or tenderness,  no skin retraction, no nipple inversion, no nipple  discharge, no skin discoloration, no axillary or supraclavicular lymphadenopathy Abdomen: no palpable masses or tenderness, no rebound or guarding Extremities: no edema or skin discoloration or tenderness  Pelvic:  [place], Urethra, Skene Glands: Within normal limits             Vagina: No gross lesions or discharge , some blood in the vault  Cervix: No gross lesions or discharge  Uterus   anteverted, normal size, shape and consistency, non-tender and mobile  Adnexa  Without masses or tenderness  Anus and perineum  normal   Rectovaginal  normal sphincter tone without palpated masses or tenderness             Hemoccult  Not indicated     Assessment/Plan:  29 y.o. female for annual exam  Strong family history of breast and colon cancer an ovarian cancer will be referred to the [facility] geneticist for counseling and possible BRCA one BRCA2 testing. We are going to change patient's oral contraceptive pill  From a 35 g pill to a 20 g pill with her next cycle. Pap smear without HPV done today.  Patient will return back to the office in 1-2 week in a fasting state for the following screening blood work: comprehensive metabolic panel , CBC, fasting lipid profile, TSH and urinalysis. We discussed importance of monthly breast exam. Flu vaccine is up-to-date   Terrance Mass MD, 1:14 PM 04/09/2015

## 2015-04-09 NOTE — Patient Instructions (Signed)
BRCA-1 and BRCA-2 Testing BRCA-1 and BRCA-2 are genes that make proteins that help repair damaged cells. BRCA-1 and BRCA-2 testing is done to see if there is a mutation in either of these genes. If there is a mutation, the genes may not be able to help repair damaged cells. As a result, the cells may develop defects that can lead to certain types of cancer. You may have this test if you have a family history of certain types of cancer, including cancer of the:  Breast.  Fallopian tubes.  Ovaries.  Peritoneum. The test requires either a sample of blood or a sample of the cells from the inside of your cheek. If a sample of blood is taken, it will be drawn from a vein in your arm using a thin needle. If a sample of cells is taken, you will get instructions on how to use a rinse to collect the sample. RESULTS It is your responsibility to obtain your test results. Ask the lab or the department doing the test when and how you will get the results. Contact your health care provider if you have any questions about your results. The lab test results can show whether:  You do not have a mutation in the BRCA-1 or BRCA-2 gene that increases your risk for certain cancers.  You have a mutation in the BRCA-1 or BRCA-2 gene that increases your risk for certain cancers.  You have a mutation in the BRCA-1 or BRCA-2 gene that has not been found to increase your risk for certain cancers. Meaning of Negative Test Results A negative test result means that you do not have a mutation in the BRCA-1 or BRCA-2 gene that is known to increase your risk for certain cancers. This does not mean you will never get cancer. Talk to your health care provider or genetic counselor about what this result means for you. Meaning of Positive Test Results A positive test result means that you do have a mutation in the BRCA-1 or BRCA-2 gene that increases your risk for certain cancers. Women with a positive test result have an  increased risk for ovarian cancer. Both women and men with a mutation have an increased risk for breast cancer and may be at greater risk for other types of cancer. Getting a positive test result does not mean you will develop cancer.  You may be told you are a carrier. This means you can pass the mutation to your children.  Talk to your health care provider or genetic counselor about what this result means for you. Meaning of Ambiguous Test Results Ambiguous, inconclusive, or uncertain test results mean there is a change in the BRCA-1 or BRCA-2 gene, but this change has not been linked to cancer. Talk to your health care provider or genetic counselor about what this result means for you.   This information is not intended to replace advice given to you by your health care provider. Make sure you discuss any questions you have with your health care provider.   Document Released: 05/04/2004 Document Revised: 05/01/2014 Document Reviewed: 07/10/2013 Elsevier Interactive Patient Education [employer].

## 2015-04-10 LAB — URINALYSIS W MICROSCOPIC + REFLEX CULTURE
Bilirubin Urine: NEGATIVE
Casts: NONE SEEN [LPF]
Crystals: NONE SEEN [HPF]
GLUCOSE, UA: NEGATIVE
Ketones, ur: NEGATIVE
LEUKOCYTES UA: NEGATIVE
NITRITE: NEGATIVE
PH: 6.5 (ref 5.0–8.0)
Protein, ur: NEGATIVE
SPECIFIC GRAVITY, URINE: 1.022 (ref 1.001–1.035)
YEAST: NONE SEEN [HPF]

## 2015-04-11 LAB — URINE CULTURE
Colony Count: NO GROWTH
ORGANISM ID, BACTERIA: NO GROWTH

## 2015-04-13 LAB — CYTOLOGY - PAP

## 2015-04-13 NOTE — Telephone Encounter (Signed)
Referral canceled by patient

## 2015-04-15 ENCOUNTER — Encounter: Admitting: Gynecology

## 2015-04-22 ENCOUNTER — Other Ambulatory Visit

## 2015-04-22 LAB — LIPID PANEL
CHOLESTEROL: 145 mg/dL (ref 125–200)
HDL: 67 mg/dL (ref >=46)
LDL CALC: 63 mg/dL (ref <130)
TRIGLYCERIDES: 74 mg/dL (ref <150)
Total CHOL/HDL Ratio: 2.2 Ratio (ref <=5.0)
VLDL: 15 mg/dL (ref <30)

## 2015-04-22 LAB — COMPREHENSIVE METABOLIC PANEL
ALBUMIN: 4.2 g/dL (ref 3.6–5.1)
ALK PHOS: 46 U/L (ref 33–115)
ALT: 15 U/L (ref 6–29)
AST: 17 U/L (ref 10–30)
BILIRUBIN TOTAL: 0.5 mg/dL (ref 0.2–1.2)
BUN: 8 mg/dL (ref 7–25)
CALCIUM: 9.6 mg/dL (ref 8.6–10.2)
CO2: 25 mmol/L (ref 20–31)
CREATININE: 0.93 mg/dL (ref 0.50–1.10)
Chloride: 100 mmol/L (ref 98–110)
Glucose, Bld: 76 mg/dL (ref 65–99)
Potassium: 4.1 mmol/L (ref 3.5–5.3)
SODIUM: 136 mmol/L (ref 135–146)
TOTAL PROTEIN: 6.9 g/dL (ref 6.1–8.1)

## 2015-04-22 LAB — CBC WITH DIFFERENTIAL/PLATELET
BASOS ABS: 0 10*3/uL (ref 0.0–0.1)
Basophils Relative: 1 % (ref 0–1)
EOS ABS: 0.1 10*3/uL (ref 0.0–0.7)
EOS PCT: 2 % (ref 0–5)
HEMATOCRIT: 40.5 % (ref 36.0–46.0)
HEMOGLOBIN: 13.6 g/dL (ref 12.0–15.0)
LYMPHS ABS: 2.4 10*3/uL (ref 0.7–4.0)
Lymphocytes Relative: 49 % — ABNORMAL HIGH (ref 12–46)
MCH: 28 pg (ref 26.0–34.0)
MCHC: 33.6 g/dL (ref 30.0–36.0)
MCV: 83.5 fL (ref 78.0–100.0)
MPV: 10.1 fL (ref 8.6–12.4)
Monocytes Absolute: 0.3 10*3/uL (ref 0.1–1.0)
Monocytes Relative: 6 % (ref 3–12)
Neutro Abs: 2.1 10*3/uL (ref 1.7–7.7)
Neutrophils Relative %: 42 % — ABNORMAL LOW (ref 43–77)
Platelets: 231 10*3/uL (ref 150–400)
RBC: 4.85 MIL/uL (ref 3.87–5.11)
RDW: 14.1 % (ref 11.5–15.5)
WBC: 4.9 10*3/uL (ref 4.0–10.5)

## 2015-04-22 LAB — TSH: TSH: 0.364 u[IU]/mL (ref 0.350–4.500)

## 2015-04-27 ENCOUNTER — Other Ambulatory Visit

## 2015-05-11 ENCOUNTER — Telehealth: Payer: Self-pay | Admitting: *Deleted

## 2015-05-11 MED ORDER — VALACYCLOVIR HCL 500 MG PO TABS: 500.0000 mg | ORAL_TABLET | Freq: Every day | ORAL | Status: DC

## 2015-05-11 NOTE — Telephone Encounter (Signed)
Pt called requesting Rx for generic valtrex 500 mg for HSV, pt said very rare outbreaks, has not needed Rx for this is in a while. Okay to send Rx?

## 2015-05-11 NOTE — Telephone Encounter (Signed)
Pt asked if she could have a 30 day supply Rx sent with refills

## 2015-05-11 NOTE — Telephone Encounter (Signed)
Yes 500mg  BID for 7 days #14 refill x 3

## 2015-05-14 ENCOUNTER — Telehealth: Payer: Self-pay | Admitting: *Deleted

## 2015-05-14 MED ORDER — LEVONORGESTREL-ETHINYL ESTRAD 0.1-20 MG-MCG PO TABS: 1.0000 | ORAL_TABLET | Freq: Every day | ORAL | Status: DC

## 2015-05-14 NOTE — Telephone Encounter (Signed)
Pt called her birth control pills where not at the pharmacy, called requesting to have Rx sent to [facility]. Rx sent. Pt aware

## 2015-06-10 ENCOUNTER — Encounter: Payer: Self-pay | Admitting: Gynecology

## 2015-07-08 ENCOUNTER — Encounter: Payer: Self-pay | Admitting: Gynecology

## 2015-07-08 ENCOUNTER — Telehealth: Payer: Self-pay | Admitting: *Deleted

## 2015-07-08 DIAGNOSIS — Z803 Family history of malignant neoplasm of breast: Secondary | ICD-10-CM

## 2015-07-08 DIAGNOSIS — Z8041 Family history of malignant neoplasm of ovary: Secondary | ICD-10-CM

## 2015-07-08 DIAGNOSIS — Z800 Family history of malignant neoplasm of digestive organs: Secondary | ICD-10-CM

## 2015-07-08 NOTE — Telephone Encounter (Signed)
Per note on 04/08/16 "Strong family history of breast and colon cancer an ovarian cancer will be referred to the [facility] regional [place] geneticist for counseling and possible BRCA one BRCA2 testing.  This was never sent to me to schedule, pt sent my chart message requesting referral ,i placed in epic they will call pt to schedule. Pt aware of this as well.

## 2015-07-21 NOTE — Telephone Encounter (Signed)
Per cancer center pt is going to check her schedule and call them back to schedule.

## 2015-08-23 ENCOUNTER — Telehealth: Payer: Self-pay | Admitting: Genetic Counselor

## 2015-08-23 NOTE — Telephone Encounter (Signed)
Lt mess regarding genetic counseling referral °

## 2015-09-27 ENCOUNTER — Other Ambulatory Visit: Payer: Self-pay | Admitting: Gynecology

## 2015-10-27 ENCOUNTER — Telehealth: Payer: Self-pay | Admitting: *Deleted

## 2015-10-27 NOTE — Telephone Encounter (Signed)
-----   Message from Sinclair Grooms sent at 10/27/2015  8:47 AM EDT ----- Regarding: nurse call Patient call to informed JF that she will be stopping her birth control.

## 2016-04-11 ENCOUNTER — Ambulatory Visit (INDEPENDENT_AMBULATORY_CARE_PROVIDER_SITE_OTHER): Admitting: Gynecology

## 2016-04-11 ENCOUNTER — Encounter: Payer: Self-pay | Admitting: Gynecology

## 2016-04-11 VITALS — BP 118/80 | Ht 68.0 in | Wt 174.0 lb

## 2016-04-11 DIAGNOSIS — Z01411 Encounter for gynecological examination (general) (routine) with abnormal findings: Secondary | ICD-10-CM

## 2016-04-11 DIAGNOSIS — N920 Excessive and frequent menstruation with regular cycle: Secondary | ICD-10-CM | POA: Diagnosis not present

## 2016-04-11 MED ORDER — LEVONORGESTREL-ETHINYL ESTRAD 0.1-20 MG-MCG PO TABS: 1.0000 | ORAL_TABLET | Freq: Every day | ORAL | 11 refills | Status: DC

## 2016-04-11 MED ORDER — TRANEXAMIC ACID 650 MG PO TABS: 1300.0000 mg | ORAL_TABLET | Freq: Three times a day (TID) | ORAL | 11 refills | Status: DC

## 2016-04-11 MED ORDER — VALACYCLOVIR HCL 500 MG PO TABS: 500.0000 mg | ORAL_TABLET | Freq: Every day | ORAL | 11 refills | Status: DC

## 2016-04-11 NOTE — Progress Notes (Signed)
Cheryl [name] 1985-09-08 [id number]   History:    30 y.o.  for annual gyn exam with the only complaint is of heavy cycles. She had been on the oral contraceptive pill in the past and was having normal menstrual cycles and discontinue it because afraid of potential side effects and now would like to return back on it. She has had no change in sexual partners. 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 Patient with no past history of any abnormal Pap smear.  Patient has informed me of significant number of relatives with cancers as follows:: Mother with breast cancer and age 26 Father with colon cancer at the age of 37 Maternal grandmother with lung cancer Maternal aunt with breast cancer Maternal cousin with ovarian cancer   she had been offered an appointment with the regional [place] geneticist for counseling but due to school was not able to break away for the appointment and would like to have that scheduled.  Past medical history,surgical history, family history and social history were all reviewed and documented in the EPIC chart.  Gynecologic History Patient's last menstrual period was 03/16/2016. Contraception: condoms Last Pap: 2015. Results were: normal Last mammogram: Not indicated. Results were: normal  Obstetric History OB History  Gravida Para Term Preterm AB Living  1 0 0 0 1 0  SAB TAB Ectopic Multiple Live Births  0 1 0 0      # Outcome Date GA Lbr Len/2nd Weight Sex Delivery Anes PTL Lv  1 TAB                ROS: A ROS was performed and pertinent positives and negatives are included in the history.  GENERAL: No fevers or chills. HEENT: No change in vision, no earache, sore throat or sinus congestion. NECK: No pain or stiffness. CARDIOVASCULAR: No chest pain or pressure. No palpitations. PULMONARY: No shortness of breath, cough or wheeze. GASTROINTESTINAL: No abdominal pain, nausea, vomiting or diarrhea, melena or bright  red blood per rectum. GENITOURINARY: No urinary frequency, urgency, hesitancy or dysuria. MUSCULOSKELETAL: No joint or muscle pain, no back pain, no recent trauma. DERMATOLOGIC: No rash, no itching, no lesions. ENDOCRINE: No polyuria, polydipsia, no heat or cold intolerance. No recent change in weight. HEMATOLOGICAL: No anemia or easy bruising or bleeding. NEUROLOGIC: No headache, seizures, numbness, tingling or weakness. PSYCHIATRIC: No depression, no loss of interest in normal activity or change in sleep pattern.     Exam: chaperone present  BP 118/80   Ht 5\' 8"  (1.727 m)   Wt 174 lb (78.9 kg)   LMP 03/16/2016   BMI 26.46 kg/m   Body mass index is 26.46 kg/m.  General appearance : Well developed well nourished female. No acute distress HEENT: Eyes: no retinal hemorrhage or exudates,  Neck supple, trachea midline, no carotid bruits, no thyroidmegaly Lungs: Clear to auscultation, no rhonchi or wheezes, or rib retractions  Heart: Regular rate and rhythm, no murmurs or gallops Breast:Examined in sitting and supine position were symmetrical in appearance, no palpable masses or tenderness,  no skin retraction, no nipple inversion, no nipple discharge, no skin discoloration, no axillary or supraclavicular lymphadenopathy Abdomen: no palpable masses or tenderness, no rebound or guarding Extremities: no edema or skin discoloration or tenderness  Pelvic:  Bartholin, Urethra, Skene Glands: Within normal limits             Vagina: No gross lesions or discharge  Cervix: No gross lesions or  discharge  Uterus  anteverted, normal size, shape and consistency, non-tender and mobile  Adnexa  Without masses or tenderness  Anus and perineum  normal   Rectovaginal  normal sphincter tone without palpated masses or tenderness             Hemoccult not indicated     Assessment/Plan:  30 y.o. female for annual exam will return to the office next week in a fasting state for the following screening blood  work: Fasting lipid profile, comprehensive metabolic panel, TSH, CBC, and urinalysis. We will make an appointment for the patient received genetic counselor as well because of family histories of different types of cancer. I've asked her to bring a list with her to that appointment. Patient will be restarted on Aviane 28 day oral contraceptive pill. Risk benefits and pros and cons to include DVT and pulmonary embolism were discussed. Also prescription refill for Valtrex 500 mg which she takes daily for HSV suppression. Pap smear not done this year according to the new guidelines.    Terrance Mass MD, 4:33 PM 04/11/2016

## 2016-04-11 NOTE — Patient Instructions (Signed)
Oral Contraception Information Oral contraceptive pills (OCPs) are medicines taken to prevent pregnancy. OCPs work by preventing the ovaries from releasing eggs. The hormones in OCPs also cause the cervical mucus to thicken, preventing the sperm from entering the uterus. The hormones also cause the uterine lining to become thin, not allowing a fertilized egg to attach to the inside of the uterus. OCPs are highly effective when taken exactly as prescribed. However, OCPs do not prevent sexually transmitted diseases (STDs). Safe sex practices, such as using condoms along with the pill, can help prevent STDs.  Before taking the pill, you may have a physical exam and Pap test. Your health care provider may order blood tests. The health care provider will make sure you are a good candidate for oral contraception. Discuss with your health care provider the possible side effects of the OCP you may be prescribed. When starting an OCP, it can take 2 to 3 months for the body to adjust to the changes in hormone levels in your body.  TYPES OF ORAL CONTRACEPTION  The combination pill-This pill contains estrogen and progestin (synthetic progesterone) hormones. The combination pill comes in 21-day, 28-day, or 91-day packs. Some types of combination pills are meant to be taken continuously (365-day pills). With 21-day packs, you do not take pills for 7 days after the last pill. With 28-day packs, the pill is taken every day. The last 7 pills are without hormones. Certain types of pills have more than 21 hormone-containing pills. With 91-day packs, the first 84 pills contain both hormones, and the last 7 pills contain no hormones or contain estrogen only.  The minipill-This pill contains the progesterone hormone only. The pill is taken every day continuously. It is very important to take the pill at the same time each day. The minipill comes in packs of 28 pills. All 28 pills contain the hormone.  ADVANTAGES OF ORAL  CONTRACEPTIVE PILLS  Decreases premenstrual symptoms.   Treats menstrual period cramps.   Regulates the menstrual cycle.   Decreases a heavy menstrual flow.   May treatacne, depending on the type of pill.   Treats abnormal uterine bleeding.   Treats polycystic ovarian syndrome.   Treats endometriosis.   Can be used as emergency contraception.  THINGS THAT CAN MAKE ORAL CONTRACEPTIVE PILLS LESS EFFECTIVE OCPs can be less effective if:   You forget to take the pill at the same time every day.   You have a stomach or intestinal disease that lessens the absorption of the pill.   You take OCPs with other medicines that make OCPs less effective, such as antibiotics, certain HIV medicines, and some seizure medicines.   You take expired OCPs.   You forget to restart the pill on day 7, when using the packs of 21 pills.  RISKS ASSOCIATED WITH ORAL CONTRACEPTIVE PILLS  Oral contraceptive pills can sometimes cause side effects, such as:  Headache.  Nausea.  Breast tenderness.  Irregular bleeding or spotting. Combination pills are also associated with a small increased risk of:  Blood clots.  Heart attack.  Stroke. This information is not intended to replace advice given to you by your health care provider. Make sure you discuss any questions you have with your health care provider. Document Released: 07/01/2002 Document Revised: 08/02/2015 Document Reviewed: 09/29/2012 Elsevier Interactive Patient Education  2017 [employer].

## 2016-04-12 ENCOUNTER — Telehealth: Payer: Self-pay | Admitting: *Deleted

## 2016-04-12 DIAGNOSIS — Z8041 Family history of malignant neoplasm of ovary: Secondary | ICD-10-CM

## 2016-04-12 DIAGNOSIS — Z800 Family history of malignant neoplasm of digestive organs: Secondary | ICD-10-CM

## 2016-04-12 DIAGNOSIS — Z803 Family history of malignant neoplasm of breast: Secondary | ICD-10-CM

## 2016-04-12 LAB — URINALYSIS W MICROSCOPIC + REFLEX CULTURE
BACTERIA UA: NONE SEEN [HPF]
BILIRUBIN URINE: NEGATIVE
CRYSTALS: NONE SEEN [HPF]
Casts: NONE SEEN [LPF]
GLUCOSE, UA: NEGATIVE
HGB URINE DIPSTICK: NEGATIVE
KETONES UR: NEGATIVE
LEUKOCYTES UA: NEGATIVE
Nitrite: NEGATIVE
PROTEIN: NEGATIVE
RBC / HPF: NONE SEEN RBC/HPF (ref <=2)
SQUAMOUS EPITHELIAL / LPF: NONE SEEN [HPF] (ref <=5)
Specific Gravity, Urine: 1.004 (ref 1.001–1.035)
Yeast: NONE SEEN [HPF]
pH: 7 (ref 5.0–8.0)

## 2016-04-12 NOTE — Telephone Encounter (Signed)
Referral placed genetic counselor they will contact pt to schedule.

## 2016-04-12 NOTE — Telephone Encounter (Signed)
-----   Message from Terrance Mass, MD sent at 04/11/2016  4:34 PM EST ----- Anderson Malta, please make an appointment for this patient with genetic counselor as a result of family history of multiple cancers she's going to bring a list with her. She would like an appointment later in the afternoon sometime in January

## 2016-04-13 LAB — URINE CULTURE: ORGANISM ID, BACTERIA: NO GROWTH

## 2016-04-15 ENCOUNTER — Other Ambulatory Visit: Payer: Self-pay | Admitting: Gynecology

## 2016-04-18 ENCOUNTER — Encounter: Payer: Self-pay | Admitting: Genetic Counselor

## 2016-04-19 ENCOUNTER — Other Ambulatory Visit

## 2016-04-20 NOTE — Telephone Encounter (Signed)
Appointment 06/26/16

## 2016-05-25 ENCOUNTER — Other Ambulatory Visit

## 2016-05-25 LAB — CBC WITH DIFFERENTIAL/PLATELET
Basophils Absolute: 43 cells/uL (ref 0–200)
Basophils Relative: 1 %
Eosinophils Absolute: 129 cells/uL (ref 15–500)
Eosinophils Relative: 3 %
HEMATOCRIT: 40.8 % (ref 35.0–45.0)
Hemoglobin: 13.3 g/dL (ref 11.7–15.5)
Lymphocytes Relative: 48 %
Lymphs Abs: 2064 cells/uL (ref 850–3900)
MCH: 28.5 pg (ref 27.0–33.0)
MCHC: 32.6 g/dL (ref 32.0–36.0)
MCV: 87.4 fL (ref 80.0–100.0)
MONO ABS: 301 {cells}/uL (ref 200–950)
MONOS PCT: 7 %
MPV: 10.6 fL (ref 7.5–12.5)
NEUTROS PCT: 41 %
Neutro Abs: 1763 cells/uL (ref 1500–7800)
Platelets: 240 10*3/uL (ref 140–400)
RBC: 4.67 MIL/uL (ref 3.80–5.10)
RDW: 14.2 % (ref 11.0–15.0)
WBC: 4.3 10*3/uL (ref 3.8–10.8)

## 2016-05-25 LAB — COMPREHENSIVE METABOLIC PANEL
ALT: 13 U/L (ref 6–29)
AST: 17 U/L (ref 10–30)
Albumin: 4.5 g/dL (ref 3.6–5.1)
Alkaline Phosphatase: 46 U/L (ref 33–115)
BUN: 7 mg/dL (ref 7–25)
CALCIUM: 9.3 mg/dL (ref 8.6–10.2)
CO2: 24 mmol/L (ref 20–31)
Chloride: 106 mmol/L (ref 98–110)
Creat: 1.02 mg/dL (ref 0.50–1.10)
GLUCOSE: 86 mg/dL (ref 65–99)
POTASSIUM: 4.1 mmol/L (ref 3.5–5.3)
Sodium: 140 mmol/L (ref 135–146)
Total Bilirubin: 0.4 mg/dL (ref 0.2–1.2)
Total Protein: 7.2 g/dL (ref 6.1–8.1)

## 2016-05-25 LAB — LIPID PANEL
Cholesterol: 136 mg/dL (ref <200)
HDL: 67 mg/dL (ref >50)
LDL Cholesterol: 61 mg/dL (ref <100)
Total CHOL/HDL Ratio: 2 ratio (ref <5.0)
Triglycerides: 39 mg/dL (ref <150)
VLDL: 8 mg/dL (ref <30)

## 2016-05-25 LAB — TSH: TSH: 0.35 m[IU]/L — AB

## 2016-05-26 ENCOUNTER — Other Ambulatory Visit

## 2016-05-26 ENCOUNTER — Other Ambulatory Visit: Payer: Self-pay | Admitting: Gynecology

## 2016-05-26 DIAGNOSIS — R7989 Other specified abnormal findings of blood chemistry: Secondary | ICD-10-CM

## 2016-05-27 LAB — THYROID PANEL WITH TSH
FREE THYROXINE INDEX: 2.6 (ref 1.4–3.8)
T3 UPTAKE: 26 % (ref 22–35)
T4 TOTAL: 10.1 ug/dL (ref 4.5–12.0)
TSH: 0.39 mIU/L — ABNORMAL LOW

## 2016-06-26 ENCOUNTER — Other Ambulatory Visit

## 2016-06-26 ENCOUNTER — Encounter: Admitting: Genetic Counselor

## 2016-07-10 ENCOUNTER — Telehealth: Payer: Self-pay | Admitting: *Deleted

## 2016-07-10 MED ORDER — VALACYCLOVIR HCL 500 MG PO TABS: 500.0000 mg | ORAL_TABLET | Freq: Every day | ORAL | 9 refills | Status: DC

## 2016-07-10 MED ORDER — NORETHINDRONE-ETH ESTRADIOL 1-35 MG-MCG PO TABS: 1.0000 | ORAL_TABLET | Freq: Every day | ORAL | 9 refills | Status: DC

## 2016-07-10 NOTE — Telephone Encounter (Signed)
Pt takes Aviane 1/20 states she has noticed increase breakthough spotting with pills, admits she may take pill 1 hour late, which cause spotting for a couple days. I reviewed the importance of taking pills day on time. Pt verbalized she understood, and asked if another pill could be prescribed? Please advise

## 2016-07-10 NOTE — Telephone Encounter (Signed)
Finish current pack then start Ortho Novum 1/35 # 1 pack with 11 refills. Compliance important

## 2016-07-10 NOTE — Telephone Encounter (Signed)
Pt informed with the below note. 

## 2016-09-06 ENCOUNTER — Encounter: Payer: Self-pay | Admitting: Gynecology

## 2016-10-18 ENCOUNTER — Telehealth: Payer: Self-pay | Admitting: *Deleted

## 2016-10-18 MED ORDER — NORETHINDRONE-ETH ESTRADIOL 1-35 MG-MCG PO TABS: ORAL_TABLET | ORAL | 1 refills | Status: DC

## 2016-10-18 NOTE — Telephone Encounter (Signed)
Unable to leave a voicemail that Rx has been sent.

## 2016-10-18 NOTE — Telephone Encounter (Signed)
Pt takes Orth-Novum 1/35 would like to take pill continuously and have a cycle every 3rd pack of pills. Current directions are take one pill daily. Okay to switch directions to the above? Please advise

## 2016-10-18 NOTE — Telephone Encounter (Signed)
Yes, have her take the first 21 pills for 2 packs and every third pack take all 28 pills that way she will have a menstrual cycle every 3 months or 4 in one year

## 2017-01-26 ENCOUNTER — Encounter (HOSPITAL_BASED_OUTPATIENT_CLINIC_OR_DEPARTMENT_OTHER): Payer: Self-pay | Admitting: *Deleted

## 2017-01-26 ENCOUNTER — Emergency Department (HOSPITAL_BASED_OUTPATIENT_CLINIC_OR_DEPARTMENT_OTHER)

## 2017-01-26 ENCOUNTER — Emergency Department (HOSPITAL_BASED_OUTPATIENT_CLINIC_OR_DEPARTMENT_OTHER)
Admission: EM | Admit: 2017-01-26 | Discharge: 2017-01-26 | Disposition: A | Discharge status: Home / Self Care | Attending: Emergency Medicine | Admitting: Emergency Medicine

## 2017-01-26 DIAGNOSIS — S8392XA Sprain of unspecified site of left knee, initial encounter: Secondary | ICD-10-CM | POA: Diagnosis not present

## 2017-01-26 DIAGNOSIS — Y9389 Activity, other specified: Secondary | ICD-10-CM | POA: Diagnosis not present

## 2017-01-26 DIAGNOSIS — S8992XA Unspecified injury of left lower leg, initial encounter: Secondary | ICD-10-CM | POA: Diagnosis present

## 2017-01-26 DIAGNOSIS — S838X2A Sprain of other specified parts of left knee, initial encounter: Secondary | ICD-10-CM

## 2017-01-26 DIAGNOSIS — Y991 Military activity: Secondary | ICD-10-CM | POA: Insufficient documentation

## 2017-01-26 DIAGNOSIS — Y9289 Other specified places as the place of occurrence of the external cause: Secondary | ICD-10-CM | POA: Diagnosis not present

## 2017-01-26 DIAGNOSIS — Z79899 Other long term (current) drug therapy: Secondary | ICD-10-CM | POA: Insufficient documentation

## 2017-01-26 DIAGNOSIS — X58XXXA Exposure to other specified factors, initial encounter: Secondary | ICD-10-CM | POA: Insufficient documentation

## 2017-01-26 NOTE — Discharge Instructions (Signed)
Continue knee brace and crutches,   Call ortho for follow up. I gave you two offices to call to see who can get you in sooner.   Rest for 3-4 days.   Return to ER if you have worse knee pain and swelling, unable to walk

## 2017-01-26 NOTE — ED Provider Notes (Signed)
[facility] Provider Note   CSN: [id number] Arrival date & time: 01/26/17  1648     History   Chief Complaint Chief Complaint  Patient presents with  . Knee Injury    HPI Cheryl [name] is a 31 y.o. female here presenting with left knee pain. Patient states that about 2 weeks ago she was in Sport and exercise psychologist and stood up suddenly and had severe left knee pain. [facility] and was given a knee brace as well as crutches. About a week ago, she states that she reinjured her knee again and had more swelling. She just finished her training and once again follow up with orthopedic doctor. Denies any further injury within the last week and she is able to walk on it. Patient works as a Marine scientist in [employer]. Has been taking motrin which helped the pain.    The history is provided by the patient.    Past Medical History:  Diagnosis Date  . HSV-2 infection     Patient Active Problem List   Diagnosis Date Noted  . Family history of breast cancer 07/10/2014  . Family history of ovarian cancer 07/10/2014  . Family history of colon cancer 07/10/2014  . Dyspareunia 01/28/2014  . Menorrhagia with regular cycle 01/28/2014  . Dysmenorrhea 01/28/2014    History reviewed. No pertinent surgical history.  OB History    Gravida Para Term Preterm AB Living   1 0 0 0 1 0   SAB TAB Ectopic Multiple Live Births   0 1 0 0         Home Medications    Prior to Admission medications   Medication Sig Start Date End Date Taking? Authorizing Provider  Cetirizine-Pseudoephedrine (ZYRTEC-D PO) Take by mouth.   Yes [provider]  Multiple Vitamin (MULTIVITAMIN WITH MINERALS) TABS Take 1 tablet by mouth daily.   Yes [provider]  valACYclovir (VALTREX) 500 MG tablet TAKE 1 TABLET BY MOUTH DAILY 04/19/16  Yes Huel Cote, NP  ibuprofen (ADVIL,MOTRIN) 800 MG tablet Take one tablet three times a day with food for five days PRN  For  pain. 03/30/14   Terrance Mass, MD  NASAL SPRAY SALINE NA Place into the nose. Reported on 04/09/2015    [provider]  norethindrone-ethinyl estradiol 1/35 (ORTHO-NOVUM, NORTREL,CYCLAFEM) tablet Take one active pill daily for 2 packs skipping placebo's, in 3rd pill pack take whole pack. 10/18/16   Terrance Mass, MD  Probiotic Product (PROBIOTIC PO) Take by mouth.    [provider]  valACYclovir (VALTREX) 500 MG tablet Take 1 tablet (500 mg total) by mouth daily. 07/10/16   Terrance Mass, MD    Family History Family History  Problem Relation Age of Onset  . Adopted: Yes  . Breast cancer Mother   . Ovarian cancer Mother   . Cancer Maternal Aunt        brain   . Cancer - Lung Maternal Grandmother        lung  . Cancer - Ovarian Cousin        ovarian-maternal  . Cancer - Colon Father     Social History Social History  Substance Use Topics  . Smoking status: Never Smoker  . Smokeless tobacco: Never Used  . Alcohol use No     Allergies   Patient has no known allergies.   Review of Systems Review of Systems  Musculoskeletal:  L knee pain   All other systems reviewed and are negative.    Physical Exam Updated Vital Signs BP 119/77   Pulse 60   Temp 97.8 F (36.6 C) (Oral)   Resp 18   Wt 73.9 kg (163 lb)   LMP 01/19/2017   SpO2 100%   BMI 24.78 kg/m   Physical Exam  Constitutional: She appears well-developed.  HENT:  Head: Normocephalic.  Eyes: Pupils are equal, round, and reactive to light.  Neck: Normal range of motion.  Cardiovascular: Normal rate.   Pulmonary/Chest: Effort normal.  Abdominal: Soft.  Musculoskeletal:  L knee with some effusion. Dec ROM from pain. PCL, ACL intact.   Skin: Skin is warm.  Psychiatric: She has a normal mood and affect.  Nursing note and vitals reviewed.    ED Treatments / Results  Labs (all labs ordered are listed, but only abnormal results are displayed) Labs Reviewed - No data to  display  EKG  EKG Interpretation None       Radiology Dg Knee Complete 4 Views Left  Result Date: 01/26/2017 CLINICAL DATA:  Fall, injury to the knee EXAM: LEFT KNEE - COMPLETE 4+ VIEW COMPARISON:  None. FINDINGS: No evidence of fracture, dislocation, or joint effusion. No evidence of arthropathy or other focal bone abnormality. Soft tissues are unremarkable. IMPRESSION: Negative. Electronically Signed   By: Donavan Foil M.D.   On: 01/26/2017 17:18    Procedures Procedures (including critical care time)  Medications Ordered in ED Medications - No data to display   Initial Impression / Assessment and Plan / ED Course  I have reviewed the triage vital signs and the nursing notes.  Pertinent labs & imaging results that were available during my care of the patient were reviewed by me and considered in my medical decision making (see chart for details).     Cheryl [name] is a 31 y.o. female here with L knee pain and swelling. Likely meniscus tear. Has knee brace and crutches. Repeat xray showed no fracture. Will dc home with ortho follow up.   Final Clinical Impressions(s) / ED Diagnoses   Final diagnoses:  None    New Prescriptions New Prescriptions   No medications on file     Drenda Freeze, MD 01/26/17 1818

## 2017-01-26 NOTE — ED Triage Notes (Signed)
Knee injury while running 2 weeks ago. She squatted down later in the week and had increased pain. She has been seen by for injury and was told she may need an MRI.

## 2017-02-01 ENCOUNTER — Encounter (INDEPENDENT_AMBULATORY_CARE_PROVIDER_SITE_OTHER): Payer: Self-pay | Admitting: Orthopedic Surgery

## 2017-02-01 ENCOUNTER — Ambulatory Visit (INDEPENDENT_AMBULATORY_CARE_PROVIDER_SITE_OTHER): Admitting: Orthopedic Surgery

## 2017-02-01 DIAGNOSIS — M25562 Pain in left knee: Secondary | ICD-10-CM | POA: Diagnosis not present

## 2017-02-01 NOTE — Progress Notes (Signed)
Office Visit Note   Patient: Cheryl [name]           Date of Birth: 10-Dec-1985           MRN: [id number] Visit Date: 02/01/2017              Requested by: Helane Rima, MD [address], [place] [id number] PCP: Helane Rima, MD  Chief Complaint  Patient presents with  . 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 Patient states she was running stepped on some uneven terrain on the parking area and had a twisting injury to her knee she was able to continue activities. [facility] she was given a neoprene knee brace and crutches. She is using ibuprofen 800 mg. She states the radiographs were negative for fracture or dislocation. She states she is currently not using the crutches feels better with wearing the knee brace and feels better with anti-inflammatories. Patient does work as a Marine scientist at [employer]: Visit Diagnoses:  1. Acute pain of left knee     Plan: We will request an MRI scan to evaluate for meniscal tear left knee. She is given a note that she may return to work on 02/05/2017. She is given a note to stay out of exercise and physical training with the [employer] until further notice.  Follow-Up Instructions: Return if symptoms worsen or fail to improve.   Ortho Exam  Patient is alert, oriented, no adenopathy, well-dressed, normal affect, normal respiratory effort. Examination patient has an antalgic gait. There is no effusion of the left knee collaterals and cruciates are stable. She is tender to palpation over the medial joint line left knee and tender to palpation over the medial facet of the patella. There is no dislocation or subluxation of the patella the extensor mechanism is intact.  Imaging: No results found. No images are attached to the encounter.  Labs: Lab Results  Component Value Date   LABORGA NO GROWTH  04/11/2016    Orders:  Orders Placed This Encounter  Procedures  . MR Knee Left w/o contrast   No orders of the defined types were placed in this encounter.    Procedures: No procedures performed  Clinical Data: No additional findings.  ROS:  All other systems negative, except as noted in the HPI. Review of Systems  Objective: Vital Signs: LMP 01/19/2017   Specialty Comments:  No specialty comments available.  PMFS History: Patient Active Problem List   Diagnosis Date Noted  . Acute pain of left knee 02/01/2017  . Family history of breast cancer 07/10/2014  . Family history of ovarian cancer 07/10/2014  . Family history of colon cancer 07/10/2014  . Dyspareunia 01/28/2014  . Menorrhagia with regular cycle 01/28/2014  . Dysmenorrhea 01/28/2014   Past Medical History:  Diagnosis Date  . HSV-2 infection     Family History  Problem Relation Age of Onset  . Adopted: Yes  . Breast cancer Mother   . Ovarian cancer Mother   . Cancer Maternal Aunt        brain   . Cancer - Lung Maternal Grandmother        lung  . Cancer - Ovarian Cousin        ovarian-maternal  . Cancer - Colon Father     No past surgical history on file.  Social History   Occupational History  . Not on file.   Social History Main Topics  . Smoking status: Never Smoker  . Smokeless tobacco: Never Used  . Alcohol use No  . Drug use: No  . Sexual activity: Not Currently    Birth control/ protection: Pill

## 2017-02-16 ENCOUNTER — Ambulatory Visit
Admission: RE | Admit: 2017-02-16 | Discharge: 2017-02-16 | Disposition: A | Discharge status: Home / Self Care | Source: Ambulatory Visit | Attending: Orthopedic Surgery | Admitting: Orthopedic Surgery

## 2017-02-16 DIAGNOSIS — M25562 Pain in left knee: Secondary | ICD-10-CM

## 2017-02-26 ENCOUNTER — Telehealth (INDEPENDENT_AMBULATORY_CARE_PROVIDER_SITE_OTHER): Payer: Self-pay | Admitting: Orthopedic Surgery

## 2017-02-26 NOTE — Telephone Encounter (Signed)
Call patient the MRI scan shows some bruising of the patella no meniscal tear no ligamentous tear, no cartilage defect.

## 2017-02-26 NOTE — Telephone Encounter (Signed)
Patient called asking for a phone call with the results from her MRI. CB # [phone]

## 2017-02-27 NOTE — Telephone Encounter (Signed)
I called patient to advise of message below also emailed results to her per her request to [email or web address]

## 2017-03-24 DIAGNOSIS — N6310 Unspecified lump in the right breast, unspecified quadrant: Secondary | ICD-10-CM

## 2017-03-24 HISTORY — DX: Unspecified lump in the right breast, unspecified quadrant: N63.10

## 2017-04-03 ENCOUNTER — Other Ambulatory Visit: Payer: Self-pay | Admitting: Family Medicine

## 2017-04-03 DIAGNOSIS — N6312 Unspecified lump in the right breast, upper inner quadrant: Secondary | ICD-10-CM

## 2017-04-04 ENCOUNTER — Other Ambulatory Visit: Payer: Self-pay | Admitting: Family Medicine

## 2017-04-04 ENCOUNTER — Ambulatory Visit
Admission: RE | Admit: 2017-04-04 | Discharge: 2017-04-04 | Disposition: A | Discharge status: Home / Self Care | Source: Ambulatory Visit | Attending: Family Medicine | Admitting: Family Medicine

## 2017-04-04 DIAGNOSIS — N6312 Unspecified lump in the right breast, upper inner quadrant: Secondary | ICD-10-CM

## 2017-04-04 DIAGNOSIS — N6310 Unspecified lump in the right breast, unspecified quadrant: Secondary | ICD-10-CM

## 2017-04-05 ENCOUNTER — Other Ambulatory Visit: Payer: Self-pay | Admitting: Family Medicine

## 2017-04-05 ENCOUNTER — Ambulatory Visit
Admission: RE | Admit: 2017-04-05 | Discharge: 2017-04-05 | Disposition: A | Discharge status: Home / Self Care | Source: Ambulatory Visit | Attending: Family Medicine | Admitting: Family Medicine

## 2017-04-05 DIAGNOSIS — N6312 Unspecified lump in the right breast, upper inner quadrant: Secondary | ICD-10-CM

## 2017-04-05 DIAGNOSIS — N6310 Unspecified lump in the right breast, unspecified quadrant: Secondary | ICD-10-CM

## 2017-04-09 ENCOUNTER — Other Ambulatory Visit: Payer: Self-pay | Admitting: Surgery

## 2017-04-10 ENCOUNTER — Encounter (HOSPITAL_BASED_OUTPATIENT_CLINIC_OR_DEPARTMENT_OTHER): Payer: Self-pay | Admitting: *Deleted

## 2017-04-10 ENCOUNTER — Other Ambulatory Visit: Payer: Self-pay

## 2017-04-10 NOTE — Progress Notes (Signed)
Ensure pre surgery drink given with instructions to complete by [place], pt verbalized understanding.

## 2017-04-10 NOTE — Pre-Procedure Instructions (Signed)
To come pick up Ensure pre-surgery drink 10 oz. - to drink by 1145 DOS.

## 2017-04-11 ENCOUNTER — Encounter: Payer: Self-pay | Admitting: Genetic Counselor

## 2017-04-12 ENCOUNTER — Telehealth: Payer: Self-pay | Admitting: *Deleted

## 2017-04-12 ENCOUNTER — Ambulatory Visit (INDEPENDENT_AMBULATORY_CARE_PROVIDER_SITE_OTHER): Admitting: Obstetrics & Gynecology

## 2017-04-12 ENCOUNTER — Encounter: Payer: Self-pay | Admitting: Obstetrics & Gynecology

## 2017-04-12 VITALS — BP 122/76 | Ht 68.0 in | Wt 155.0 lb

## 2017-04-12 DIAGNOSIS — E049 Nontoxic goiter, unspecified: Secondary | ICD-10-CM

## 2017-04-12 DIAGNOSIS — Z803 Family history of malignant neoplasm of breast: Secondary | ICD-10-CM

## 2017-04-12 DIAGNOSIS — R7989 Other specified abnormal findings of blood chemistry: Secondary | ICD-10-CM

## 2017-04-12 DIAGNOSIS — Z7251 High risk heterosexual behavior: Secondary | ICD-10-CM

## 2017-04-12 DIAGNOSIS — Z113 Encounter for screening for infections with a predominantly sexual mode of transmission: Secondary | ICD-10-CM | POA: Diagnosis not present

## 2017-04-12 DIAGNOSIS — Z3169 Encounter for other general counseling and advice on procreation: Secondary | ICD-10-CM | POA: Diagnosis not present

## 2017-04-12 DIAGNOSIS — Z01419 Encounter for gynecological examination (general) (routine) without abnormal findings: Secondary | ICD-10-CM | POA: Diagnosis not present

## 2017-04-12 DIAGNOSIS — Z1151 Encounter for screening for human papillomavirus (HPV): Secondary | ICD-10-CM | POA: Diagnosis not present

## 2017-04-12 NOTE — Telephone Encounter (Signed)
-----   Message from Princess Bruins, MD sent at 04/12/2017 12:40 PM EST ----- Regarding: Refer to Genetic Counselor Mother with Breast Ca at 48.  Many other Cancers in family.  Patient currently investigated for a Breast lump, Bx scheduled tomorrow.

## 2017-04-12 NOTE — Progress Notes (Signed)
Cheryl [name] 04/15/1986 [id number]   History:    31 y.o. 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  RP:  Established patient presenting for annual gyn exam   HPI:  Stopped BCPs last month.  Wants to be more "natural" and will start attempting conception in the spring.  Last menstrual period normal on April 06, 2017.  No pelvic pain.  Normal vaginal secretions.  Breasts normal.  Mother with history of breast cancer at age 68.  Father with prostate cancer in his 43s.  79 brother with colon cancer at age 40 (They have the same mother, but not the same father and her half brother's father had colon cancer).  Second-degree relatives with ovarian cancer.  Would like referral for genetic testing.  Urine and bowel movements normal.  Physically active regularly.  Past medical history,surgical history, family history and social history were all reviewed and documented in the EPIC chart.  Gynecologic History Patient's last menstrual period was 04/06/2017 (exact date). Contraception: rhythm method Last Pap: 2016. Results were: normal Last mammogram: 2018. Results were: normal  Obstetric History OB History  Gravida Para Term Preterm AB Living  1 0 0 0 1 0  SAB TAB Ectopic Multiple Live Births  0 1 0 0      # Outcome Date GA Lbr Len/2nd Weight Sex Delivery Anes PTL Lv  1 TAB                ROS: A ROS was performed and pertinent positives and negatives are included in the history.  GENERAL: No fevers or chills. HEENT: No change in vision, no earache, sore throat or sinus congestion. NECK: No pain or stiffness. CARDIOVASCULAR: No chest pain or pressure. No palpitations. PULMONARY: No shortness of breath, cough or wheeze. GASTROINTESTINAL: No abdominal pain, nausea, vomiting or diarrhea, melena or bright red blood per rectum. GENITOURINARY: No urinary frequency, urgency, hesitancy or dysuria. MUSCULOSKELETAL: No joint or muscle pain, no back pain, no recent trauma.  DERMATOLOGIC: No rash, no itching, no lesions. ENDOCRINE: No polyuria, polydipsia, no heat or cold intolerance. No recent change in weight. HEMATOLOGICAL: No anemia or easy bruising or bleeding. NEUROLOGIC: No headache, seizures, numbness, tingling or weakness. PSYCHIATRIC: No depression, no loss of interest in normal activity or change in sleep pattern.     Exam:   BP 122/76   Ht 5\' 8"  (1.727 m)   Wt 155 lb (70.3 kg)   LMP 04/06/2017 (Exact Date) Comment: no birth control  BMI 23.57 kg/m   Body mass index is 23.57 kg/m.  General appearance : Well developed well nourished female. No acute distress HEENT: Eyes: no retinal hemorrhage or exudates,  Neck supple, trachea midline, no carotid bruits, mild thyroidmegaly present. Lungs: Clear to auscultation, no rhonchi or wheezes, or rib retractions  Heart: Regular rate and rhythm, no murmurs or gallops Breast:Examined in sitting and supine position were symmetrical in appearance, no palpable masses or tenderness,  no skin retraction, no nipple inversion, no nipple discharge, no skin discoloration, no axillary or supraclavicular lymphadenopathy Abdomen: no palpable masses or tenderness, no rebound or guarding Extremities: no edema or skin discoloration or tenderness  Pelvic: Vulva normal  Bartholin, Urethra, Skene Glands: Within normal limits             Vagina: No gross lesions or discharge  Cervix: No gross lesions or discharge.  Pap/HR HPV with Gono-chlam done.  Uterus  AV, normal size, shape and consistency, non-tender and mobile  Adnexa  Without masses or tenderness  Anus and perineum  normal    Assessment/Plan:  31 y.o. female for annual exam   1. Encounter for routine gynecological examination with Papanicolaou smear of cervix Normal gynecologic exam.  Pap with high risk HPV done.  Breast exam normal.  Health labs with family physician.  2. Unprotected sexual intercourse Rule out early pregnancy given unprotected intercourse 2  weeks ago.  Gonorrhea and Chlamydia done on Pap test.  Recommend using condoms until ready to attempt conception. - hCG, serum, qualitative  3. Encounter for preconception consultation At the patient's preference, will use natural method for contraception until ready to attempt conception in the spring 2019.  Recommend starting on prenatal vitamins.  4. Goiter Mild diffuse goiter.  Refer to endocrinology for investigation and management.  5. Family history of breast cancer in mother Mother with breast cancer at age 23.  Father with prostate cancer in his 55s.  Ovarian cancer in second-degree relatives.  Half brother (same mother) with colon cancer at age 9, but probably genetic risk on his father's side.  Decision to refer for genetic counseling to perform genetic testing.  Counseling on above issues more than 50% for 10 minutes.  Princess Bruins MD, 12:30 PM 04/12/2017

## 2017-04-12 NOTE — Telephone Encounter (Signed)
Dr.Lavoie also wanted pt to have referral to endocrinology due to Goiter with low TSH. Both referrals placed. I told pt both offices will call her to schedule and to look out for a call from them. Pt verbalized she understood.

## 2017-04-13 ENCOUNTER — Ambulatory Visit (HOSPITAL_BASED_OUTPATIENT_CLINIC_OR_DEPARTMENT_OTHER)
Admission: RE | Admit: 2017-04-13 | Discharge: 2017-04-13 | Disposition: A | Discharge status: Home / Self Care | Source: Ambulatory Visit | Attending: Surgery | Admitting: Surgery

## 2017-04-13 ENCOUNTER — Encounter (HOSPITAL_BASED_OUTPATIENT_CLINIC_OR_DEPARTMENT_OTHER): Payer: Self-pay | Admitting: *Deleted

## 2017-04-13 ENCOUNTER — Encounter (HOSPITAL_BASED_OUTPATIENT_CLINIC_OR_DEPARTMENT_OTHER)
Admission: RE | Disposition: A | Discharge status: Home / Self Care | Payer: Self-pay | Source: Ambulatory Visit | Attending: Surgery

## 2017-04-13 ENCOUNTER — Ambulatory Visit (HOSPITAL_BASED_OUTPATIENT_CLINIC_OR_DEPARTMENT_OTHER): Admitting: Anesthesiology

## 2017-04-13 ENCOUNTER — Other Ambulatory Visit: Payer: Self-pay

## 2017-04-13 DIAGNOSIS — K219 Gastro-esophageal reflux disease without esophagitis: Secondary | ICD-10-CM | POA: Diagnosis not present

## 2017-04-13 DIAGNOSIS — N6310 Unspecified lump in the right breast, unspecified quadrant: Secondary | ICD-10-CM | POA: Diagnosis present

## 2017-04-13 DIAGNOSIS — D241 Benign neoplasm of right breast: Secondary | ICD-10-CM | POA: Diagnosis not present

## 2017-04-13 HISTORY — PX: BREAST LUMPECTOMY: SHX2

## 2017-04-13 HISTORY — DX: Unspecified lump in the right breast, unspecified quadrant: N63.10

## 2017-04-13 HISTORY — DX: Gastro-esophageal reflux disease without esophagitis: K21.9

## 2017-04-13 LAB — HCG, SERUM, QUALITATIVE: Preg, Serum: NEGATIVE

## 2017-04-13 SURGERY — BREAST LUMPECTOMY
Anesthesia: General | Site: Breast | Laterality: Right

## 2017-04-13 MED ORDER — EPHEDRINE SULFATE 50 MG/ML IJ SOLN
INTRAMUSCULAR | Status: AC
  Filled 2017-04-13: qty 1

## 2017-04-13 MED ORDER — DEXAMETHASONE SODIUM PHOSPHATE 10 MG/ML IJ SOLN
INTRAMUSCULAR | Status: AC
  Filled 2017-04-13: qty 1

## 2017-04-13 MED ORDER — LIDOCAINE HCL (CARDIAC) 20 MG/ML IV SOLN
INTRAVENOUS | Status: DC | PRN
  Administered 2017-04-13: 50 mg via INTRAVENOUS

## 2017-04-13 MED ORDER — CHLORHEXIDINE GLUCONATE CLOTH 2 % EX PADS: 6.0000 | MEDICATED_PAD | Freq: Once | CUTANEOUS | Status: DC

## 2017-04-13 MED ORDER — SUCCINYLCHOLINE CHLORIDE 200 MG/10ML IV SOSY
PREFILLED_SYRINGE | INTRAVENOUS | Status: AC
  Filled 2017-04-13: qty 10

## 2017-04-13 MED ORDER — SODIUM CHLORIDE 0.9% FLUSH: 3.0000 mL | INTRAVENOUS | Status: DC | PRN

## 2017-04-13 MED ORDER — MIDAZOLAM HCL 2 MG/2ML IJ SOLN
INTRAMUSCULAR | Status: AC
  Filled 2017-04-13: qty 2

## 2017-04-13 MED ORDER — OXYCODONE HCL 5 MG/5ML PO SOLN: 5.0000 mg | Freq: Once | ORAL | Status: DC | PRN

## 2017-04-13 MED ORDER — CELECOXIB 200 MG PO CAPS
200.0000 mg | ORAL_CAPSULE | ORAL | Status: AC
  Administered 2017-04-13: 200 mg via ORAL

## 2017-04-13 MED ORDER — CEFAZOLIN SODIUM-DEXTROSE 2-4 GM/100ML-% IV SOLN
INTRAVENOUS | Status: AC
  Filled 2017-04-13: qty 100

## 2017-04-13 MED ORDER — ACETAMINOPHEN 500 MG PO TABS
1000.0000 mg | ORAL_TABLET | ORAL | Status: AC
  Administered 2017-04-13: 1000 mg via ORAL

## 2017-04-13 MED ORDER — LIDOCAINE 2% (20 MG/ML) 5 ML SYRINGE
INTRAMUSCULAR | Status: AC
  Filled 2017-04-13: qty 5

## 2017-04-13 MED ORDER — ONDANSETRON HCL 4 MG/2ML IJ SOLN
INTRAMUSCULAR | Status: AC
  Filled 2017-04-13: qty 2

## 2017-04-13 MED ORDER — SODIUM CHLORIDE 0.9% FLUSH: 3.0000 mL | Freq: Two times a day (BID) | INTRAVENOUS | Status: DC

## 2017-04-13 MED ORDER — ONDANSETRON HCL 4 MG/2ML IJ SOLN
INTRAMUSCULAR | Status: DC | PRN
  Administered 2017-04-13: 4 mg via INTRAVENOUS

## 2017-04-13 MED ORDER — FENTANYL CITRATE (PF) 100 MCG/2ML IJ SOLN
50.0000 ug | INTRAMUSCULAR | Status: DC | PRN
  Administered 2017-04-13: 100 ug via INTRAVENOUS

## 2017-04-13 MED ORDER — SODIUM CHLORIDE 0.9 % IV SOLN
250.0000 mL | INTRAVENOUS | Status: DC | PRN
Start: 2017-04-13 — End: 2017-04-13

## 2017-04-13 MED ORDER — BUPIVACAINE-EPINEPHRINE (PF) 0.5% -1:200000 IJ SOLN
INTRAMUSCULAR | Status: AC
  Filled 2017-04-13: qty 30

## 2017-04-13 MED ORDER — CELECOXIB 200 MG PO CAPS
ORAL_CAPSULE | ORAL | Status: AC
  Filled 2017-04-13: qty 1

## 2017-04-13 MED ORDER — PROPOFOL 10 MG/ML IV BOLUS
INTRAVENOUS | Status: AC
Start: 2017-04-13 — End: ?
  Filled 2017-04-13: qty 20

## 2017-04-13 MED ORDER — FENTANYL CITRATE (PF) 100 MCG/2ML IJ SOLN: 25.0000 ug | INTRAMUSCULAR | Status: DC | PRN

## 2017-04-13 MED ORDER — PHENYLEPHRINE 40 MCG/ML (10ML) SYRINGE FOR IV PUSH (FOR BLOOD PRESSURE SUPPORT)
PREFILLED_SYRINGE | INTRAVENOUS | Status: AC
  Filled 2017-04-13: qty 10

## 2017-04-13 MED ORDER — ACETAMINOPHEN 650 MG RE SUPP: 650.0000 mg | RECTAL | Status: DC | PRN

## 2017-04-13 MED ORDER — SCOPOLAMINE 1 MG/3DAYS TD PT72: 1.0000 | MEDICATED_PATCH | Freq: Once | TRANSDERMAL | Status: DC | PRN

## 2017-04-13 MED ORDER — GABAPENTIN 300 MG PO CAPS
300.0000 mg | ORAL_CAPSULE | ORAL | Status: AC
  Administered 2017-04-13: 300 mg via ORAL

## 2017-04-13 MED ORDER — OXYCODONE HCL 5 MG PO TABS: 5.0000 mg | ORAL_TABLET | ORAL | Status: DC | PRN

## 2017-04-13 MED ORDER — MIDAZOLAM HCL 2 MG/2ML IJ SOLN
1.0000 mg | INTRAMUSCULAR | Status: DC | PRN
  Administered 2017-04-13: 2 mg via INTRAVENOUS

## 2017-04-13 MED ORDER — PROPOFOL 10 MG/ML IV BOLUS
INTRAVENOUS | Status: AC
  Filled 2017-04-13: qty 20

## 2017-04-13 MED ORDER — ONDANSETRON HCL 4 MG/2ML IJ SOLN: 4.0000 mg | Freq: Four times a day (QID) | INTRAMUSCULAR | Status: DC | PRN

## 2017-04-13 MED ORDER — CEFAZOLIN SODIUM-DEXTROSE 2-4 GM/100ML-% IV SOLN
2.0000 g | INTRAVENOUS | Status: AC
  Administered 2017-04-13: 2 g via INTRAVENOUS

## 2017-04-13 MED ORDER — BUPIVACAINE-EPINEPHRINE 0.5% -1:200000 IJ SOLN
INTRAMUSCULAR | Status: DC | PRN
  Administered 2017-04-13: 5 mL

## 2017-04-13 MED ORDER — MORPHINE SULFATE (PF) 2 MG/ML IV SOLN: 1.0000 mg | INTRAVENOUS | Status: DC | PRN

## 2017-04-13 MED ORDER — DEXAMETHASONE SODIUM PHOSPHATE 4 MG/ML IJ SOLN
INTRAMUSCULAR | Status: DC | PRN
  Administered 2017-04-13: 10 mg via INTRAVENOUS

## 2017-04-13 MED ORDER — BUPIVACAINE-EPINEPHRINE (PF) 0.5% -1:200000 IJ SOLN
INTRAMUSCULAR | Status: AC
Start: 2017-04-13 — End: ?
  Filled 2017-04-13: qty 30

## 2017-04-13 MED ORDER — OXYCODONE HCL 5 MG PO TABS: 5.0000 mg | ORAL_TABLET | Freq: Once | ORAL | Status: DC | PRN

## 2017-04-13 MED ORDER — SODIUM BICARBONATE 4 % IV SOLN
INTRAVENOUS | Status: AC
Start: 2017-04-13 — End: ?
  Filled 2017-04-13: qty 5

## 2017-04-13 MED ORDER — FENTANYL CITRATE (PF) 100 MCG/2ML IJ SOLN
INTRAMUSCULAR | Status: AC
  Filled 2017-04-13: qty 2

## 2017-04-13 MED ORDER — LIDOCAINE HCL (PF) 1 % IJ SOLN
INTRAMUSCULAR | Status: AC
  Filled 2017-04-13: qty 30

## 2017-04-13 MED ORDER — ACETAMINOPHEN 500 MG PO TABS
ORAL_TABLET | ORAL | Status: AC
Start: 2017-04-13 — End: ?
  Filled 2017-04-13: qty 2

## 2017-04-13 MED ORDER — OXYCODONE HCL 5 MG PO TABS: 5.0000 mg | ORAL_TABLET | Freq: Four times a day (QID) | ORAL | 0 refills | Status: AC | PRN

## 2017-04-13 MED ORDER — LACTATED RINGERS IV SOLN
INTRAVENOUS | Status: DC
  Administered 2017-04-13: 14:00:00 via INTRAVENOUS

## 2017-04-13 MED ORDER — ONDANSETRON HCL 4 MG/2ML IJ SOLN
INTRAMUSCULAR | Status: AC
Start: 2017-04-13 — End: ?
  Filled 2017-04-13: qty 2

## 2017-04-13 MED ORDER — ACETAMINOPHEN 325 MG PO TABS: 650.0000 mg | ORAL_TABLET | ORAL | Status: DC | PRN

## 2017-04-13 MED ORDER — GABAPENTIN 300 MG PO CAPS
ORAL_CAPSULE | ORAL | Status: AC
Start: 2017-04-13 — End: ?
  Filled 2017-04-13: qty 1

## 2017-04-13 SURGICAL SUPPLY — 42 items
BLADE HEX COATED 2.75 (ELECTRODE) ×3 IMPLANT
BLADE SURG 15 STRL LF DISP TIS (BLADE) ×1 IMPLANT
BLADE SURG 15 STRL SS (BLADE) ×2
CANISTER SUCT 1200ML W/VALVE (MISCELLANEOUS) IMPLANT
CHLORAPREP W/TINT 26ML (MISCELLANEOUS) ×3 IMPLANT
CLIP VESOCCLUDE SM WIDE 6/CT (CLIP) IMPLANT
COVER BACK TABLE 60X90IN (DRAPES) ×3 IMPLANT
COVER MAYO STAND STRL (DRAPES) ×3 IMPLANT
DECANTER SPIKE VIAL GLASS SM (MISCELLANEOUS) IMPLANT
DERMABOND ADVANCED (GAUZE/BANDAGES/DRESSINGS) ×2
DERMABOND ADVANCED .7 DNX12 (GAUZE/BANDAGES/DRESSINGS) ×1 IMPLANT
DEVICE DUBIN W/COMP PLATE 8390 (MISCELLANEOUS) IMPLANT
DRAPE LAPAROTOMY 100X72 PEDS (DRAPES) ×3 IMPLANT
DRAPE UTILITY XL STRL (DRAPES) ×3 IMPLANT
ELECT REM PT RETURN 9FT ADLT (ELECTROSURGICAL) ×3
ELECTRODE REM PT RTRN 9FT ADLT (ELECTROSURGICAL) ×1 IMPLANT
GAUZE SPONGE 4X4 12PLY STRL LF (GAUZE/BANDAGES/DRESSINGS) IMPLANT
GLOVE BIOGEL PI IND STRL 7.0 (GLOVE) ×1 IMPLANT
GLOVE BIOGEL PI INDICATOR 7.0 (GLOVE) ×2
GLOVE ECLIPSE 6.5 STRL STRAW (GLOVE) ×3 IMPLANT
GLOVE SURG SIGNA 7.5 PF LTX (GLOVE) ×3 IMPLANT
GOWN STRL REUS W/ TWL LRG LVL3 (GOWN DISPOSABLE) ×1 IMPLANT
GOWN STRL REUS W/ TWL XL LVL3 (GOWN DISPOSABLE) ×1 IMPLANT
GOWN STRL REUS W/TWL LRG LVL3 (GOWN DISPOSABLE) ×2
GOWN STRL REUS W/TWL XL LVL3 (GOWN DISPOSABLE) ×2
KIT MARKER MARGIN INK (KITS) IMPLANT
NEEDLE HYPO 25X1 1.5 SAFETY (NEEDLE) ×3 IMPLANT
NS IRRIG 1000ML POUR BTL (IV SOLUTION) IMPLANT
PACK BASIN DAY SURGERY FS (CUSTOM PROCEDURE TRAY) ×3 IMPLANT
PENCIL BUTTON HOLSTER BLD 10FT (ELECTRODE) ×3 IMPLANT
SLEEVE SCD COMPRESS KNEE MED (MISCELLANEOUS) ×3 IMPLANT
SPONGE LAP 4X18 X RAY DECT (DISPOSABLE) ×3 IMPLANT
SUT MNCRL AB 4-0 PS2 18 (SUTURE) ×3 IMPLANT
SUT SILK 2 0 SH (SUTURE) ×3 IMPLANT
SUT VIC AB 3-0 SH 27 (SUTURE) ×2
SUT VIC AB 3-0 SH 27X BRD (SUTURE) ×1 IMPLANT
SYR CONTROL 10ML LL (SYRINGE) ×3 IMPLANT
TOWEL OR 17X24 6PK STRL BLUE (TOWEL DISPOSABLE) ×3 IMPLANT
TOWEL OR NON WOVEN STRL DISP B (DISPOSABLE) IMPLANT
TUBE CONNECTING 20'X1/4 (TUBING)
TUBE CONNECTING 20X1/4 (TUBING) IMPLANT
YANKAUER SUCT BULB TIP NO VENT (SUCTIONS) IMPLANT

## 2017-04-13 NOTE — Interval H&P Note (Signed)
History and Physical Interval Note: no change in H and P 04/13/2017 1:05 PM  Cheryl [name]  has presented today for surgery, with the diagnosis of RIGHT BREAST MASS  The various methods of treatment have been discussed with the patient and family. After consideration of risks, benefits and other options for treatment, the patient has consented to  Procedure(s): RIGHT BREAST LUMPECTOMY (Right) as a surgical intervention .  The patient's history has been reviewed, patient examined, no change in status, stable for surgery.  I have reviewed the patient's chart and labs.  Questions were answered to the patient's satisfaction.     [name] A

## 2017-04-13 NOTE — Anesthesia Preprocedure Evaluation (Signed)
Anesthesia Evaluation  Patient identified by MRN, date of birth, ID band Patient awake    Reviewed: Allergy & Precautions, H&P , NPO status , Patient's Chart, lab work & pertinent test results  Airway Mallampati: II   Neck ROM: full    Dental   Pulmonary neg pulmonary ROS,    breath sounds clear to auscultation       Cardiovascular negative cardio ROS   Rhythm:regular Rate:Normal     Neuro/Psych    GI/Hepatic GERD  ,  Endo/Other    Renal/GU      Musculoskeletal   Abdominal   Peds  Hematology   Anesthesia Other Findings   Reproductive/Obstetrics                             Anesthesia Physical Anesthesia Plan  ASA: II  Anesthesia Plan: General   Post-op Pain Management:    Induction: Intravenous  PONV Risk Score and Plan: 3 and Ondansetron, Dexamethasone, Midazolam and Treatment may vary due to age or medical condition  Airway Management Planned: LMA  Additional Equipment:   Intra-op Plan:   Post-operative Plan:   Informed Consent: I have reviewed the patients History and Physical, chart, labs and discussed the procedure including the risks, benefits and alternatives for the proposed anesthesia with the patient or authorized representative who has indicated his/her understanding and acceptance.     Plan Discussed with: CRNA, Anesthesiologist and Surgeon  Anesthesia Plan Comments:         Anesthesia Quick Evaluation

## 2017-04-13 NOTE — Op Note (Signed)
RIGHT BREAST LUMPECTOMY  Procedure Note  Cheryl [name] 04/13/2017   Pre-op Diagnosis: RIGHT BREAST MASS     Post-op Diagnosis: same  Procedure(s): RIGHT BREAST LUMPECTOMY  Surgeon(s): Coralie Keens, MD  Anesthesia: General  Staff:  Circulator: Ted Mcalpine, RN Scrub Person: Izora Ribas, RN  Estimated Blood Loss: Minimal               Specimens: sent to path  Indications: This is [name] 31 year old female with [name] right breast mass.  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  Decision has been made to proceed with lumpectomy  Procedure: The patient was brought to the operating room and identified as the correct patient.  She was placed supine on the operating room table and general anesthesia was induced.  Her right breast was then prepped and draped in the usual sterile fashion.  I anesthetized the skin over the palpable mass with Marcaine.  I then made an elliptical incision with [name] scalpel.  I dissected down to the breast tissue with electrocautery.  I excised the skin and the underlying mass going down to the chest wall muscle.  Once the mass was removed I marked it at the 12 o'clock position with [name] silk suture.  It was then sent to pathology for evaluation.  I achieved hemostasis with the cautery.  I anesthetized the wound further with Marcaine.  I then closed the subcutaneous tissue with interrupted 3-0 Vicryl sutures and closed the skin with [name] running 4-0 Monocryl.  Dermabond was then applied.  The patient tolerated the procedure well.  All the counts were correct at the end of the procedure.  The patient was then extubated in the operating room and taken in [name] stable condition to the recovery room.          Cheryl [name]   Date: 04/13/2017  Time: 2:53 PM

## 2017-04-13 NOTE — Anesthesia Procedure Notes (Signed)
Procedure Name: LMA Insertion Date/Time: 04/13/2017 2:31 PM Performed by: Willa Frater, CRNA Pre-anesthesia Checklist: Patient identified, Emergency Drugs available, Suction available and Patient being monitored Patient Re-evaluated:Patient Re-evaluated prior to induction Oxygen Delivery Method: Circle system utilized Preoxygenation: Pre-oxygenation with 100% oxygen Induction Type: IV induction Ventilation: Mask ventilation without difficulty LMA: LMA inserted LMA Size: 4.0 Number of attempts: 1 Airway Equipment and Method: Bite block Placement Confirmation: positive ETCO2 Tube secured with: Tape Dental Injury: Teeth and Oropharynx as per pre-operative assessment

## 2017-04-13 NOTE — H&P (Signed)
Cheryl [name] Documented: 04/09/2017 3:07 PM Location: [facility] Patient #: [id number] DOB: 23-Jun-1985 Single / Language: [name] / Race: Black or African American Female   History of Present Illness (Erna Brossard A. Ninfa Linden MD; 04/09/2017 3:27 PM) The patient is a 31 year old female who presents with a breast mass. This patient is referred by Dr. Margarette Canada for evaluation of a right breast mass. The patient recently felt a mass while examining herself. She has had no previous history of breast masses. She denies nipple discharge. She underwent a mammogram and ultrasound showing an 8x 6 x 9 mm mass at the 2 o'clock position of the right breast 7 cm from the nipple. 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 She is otherwise healthy and without complaints. Her mother was diagnosed with breast cancer at the age of 75 and her brother has colon cancer.   Past Surgical History (Tanisha A. Owens Shark, [place]; 04/09/2017 3:07 PM) No pertinent past surgical history   Diagnostic Studies History (Tanisha A. Owens Shark, [place]; 04/09/2017 3:07 PM) Colonoscopy  never Mammogram  within last year Pap Smear  1-5 years ago  Allergies (Tanisha A. Owens Shark, [place]; 04/09/2017 3:08 PM) No Known Drug Allergies [04/09/2017]: Allergies Reconciled   Medication History (Tanisha A. Owens Shark, [place]; 04/09/2017 3:09 PM) ValACYclovir HCl (500MG  Tablet, Oral) Active. Meloxicam (15MG  Tablet, Oral) Active. Multi-Vitamin (Oral) Active. Medications Reconciled  Social History (Tanisha A. Owens Shark, [place]; 04/09/2017 3:08 PM) Alcohol use  Occasional alcohol use. Caffeine use  Tea. No drug use  Tobacco use  Never smoker.  Family History (Tanisha A. Owens Shark, [place]; 04/09/2017 3:08 PM) Alcohol Abuse  Brother, Father, Mother. Arthritis  Mother. Breast Cancer  Mother. Colon Cancer  Brother. Hypertension  Mother. Prostate Cancer  Father. Respiratory Condition   Mother.  Pregnancy / Birth History (Tanisha A. Owens Shark, [place]; 04/09/2017 3:08 PM) Age at menarche  26 years. Contraceptive History  Oral contraceptives. Gravida  1 Maternal age  42-25 Para  0 Regular periods   Other Problems (Tanisha A. Owens Shark, [place]; 04/09/2017 3:08 PM) Gastroesophageal Reflux Disease     Review of Systems (Tanisha A. Brown RMA; 04/09/2017 3:08 PM) General Not Present- Appetite Loss, Chills, Fatigue, Fever, Night Sweats, Weight Gain and Weight Loss. Skin Not Present- Change in Wart/Mole, Dryness, Hives, Jaundice, New Lesions, Non-Healing Wounds, Rash and Ulcer. HEENT Not Present- Earache, Hearing Loss, Hoarseness, Nose Bleed, Oral Ulcers, Ringing in the Ears, Seasonal Allergies, Sinus Pain, Sore Throat, Visual Disturbances, Wears glasses/contact lenses and Yellow Eyes. Breast Present- Breast Mass. Not Present- Breast Pain, Nipple Discharge and Skin Changes. Cardiovascular Not Present- Chest Pain, Difficulty Breathing Lying Down, Leg Cramps, Palpitations, Rapid Heart Rate, Shortness of Breath and Swelling of Extremities. Gastrointestinal Not Present- Abdominal Pain, Bloating, Bloody Stool, Change in Bowel Habits, Chronic diarrhea, Constipation, Difficulty Swallowing, Excessive gas, Gets full quickly at meals, Hemorrhoids, Indigestion, Nausea, Rectal Pain and Vomiting. Female Genitourinary Not Present- Frequency, Nocturia, Painful Urination, Pelvic Pain and Urgency. Musculoskeletal Not Present- Back Pain, Joint Pain, Joint Stiffness, Muscle Pain, Muscle Weakness and Swelling of Extremities. Neurological Not Present- Decreased Memory, Fainting, Headaches, Numbness, Seizures, Tingling, Tremor, Trouble walking and Weakness. Psychiatric Not Present- Anxiety, Bipolar, Change in Sleep Pattern, Depression, Fearful and Frequent crying. Endocrine Not Present- Cold Intolerance, Excessive Hunger, Hair Changes, Heat Intolerance, Hot flashes and New Diabetes. Hematology Not Present-  Blood Thinners, Easy Bruising, Excessive bleeding, Gland problems, HIV and Persistent Infections.  Vitals (Tanisha A. Brown RMA; 04/09/2017 3:08 PM) 04/09/2017 3:08 PM  Weight: 158 lb Height: 69in Body Surface Area: 1.87 m Body Mass Index: 23.33 kg/m  Temp.: 97.37F  Pulse: 91 (Regular)  BP: 116/72 (Sitting, Left Arm, Standard)       Physical Exam (Ishmail Mcmanamon A. Ninfa Linden MD; 04/09/2017 3:28 PM) The physical exam findings are as follows: Note:On examination, there is a less than 1 cm mass at the 2 o'clock position of the right breast 7 cm from the nipple. It is minimally mobile. There are no skin changes    Assessment & Plan (Eyal Greenhaw A. 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 Removal of this is recommended for histologic evaluation. I will probably have to make the incision over the mass to remove the skin as well because of the nature of the tumor. I discussed this procedure in detail. We discussed the risk which includes but is not limited to bleeding, infection, injury to surrounding structures, the need for further procedures, etc. Her mother will be undergoing eventual genetic testing. Surgery will be scheduled

## 2017-04-13 NOTE — Transfer of Care (Signed)
Immediate Anesthesia Transfer of Care Note  Patient: Cheryl [name]  Procedure(s) Performed: RIGHT BREAST LUMPECTOMY (Right Breast)  Patient Location: PACU  Anesthesia Type:General  Level of Consciousness: sedated  Airway & Oxygen Therapy: Patient Spontanous Breathing and Patient connected to face mask oxygen  Post-op Assessment: Report given to RN and Post -op Vital signs reviewed and stable  Post vital signs: Reviewed and stable  Last Vitals:  Vitals:   04/13/17 1318  BP: 120/72  Pulse: 97  Resp: 16  Temp: 36.8 C  SpO2: 99%    Last Pain:  Vitals:   04/13/17 1318  TempSrc: Oral         Complications: No apparent anesthesia complications

## 2017-04-13 NOTE — Discharge Instructions (Signed)
[facility] Office Phone Number [phone]  BREAST BIOPSY/ LUMPECTOMY: POST OP INSTRUCTIONS  Always review your discharge instruction sheet given to you by the facility where your surgery was performed.  IF YOU HAVE DISABILITY OR FAMILY LEAVE FORMS, YOU MUST BRING THEM TO THE OFFICE FOR PROCESSING.  DO NOT GIVE THEM TO YOUR DOCTOR.  1. A prescription for pain medication may be given to you upon discharge.  Take your pain medication as prescribed, if needed.  If narcotic pain medicine is not needed, then you may take acetaminophen (Tylenol) or ibuprofen (Advil) as needed. No Tylenol until 7:30pm, no ibuprofen until 1:30am 2. Take your usually prescribed medications unless otherwise directed 3. If you need a refill on your pain medication, please contact your pharmacy.  They will contact our office to request authorization.  Prescriptions will not be filled after 5pm or on week-ends. 4. You should eat very light the first 24 hours after surgery, such as soup, crackers, pudding, etc.  Resume your normal diet the day after surgery. 5. Most patients will experience some swelling and bruising in the breast.  Ice packs and a good support bra will help.  Swelling and bruising can take several days to resolve.  6. It is common to experience some constipation if taking pain medication after surgery.  Increasing fluid intake and taking a stool softener will usually help or prevent this problem from occurring.  A mild laxative (Milk of Magnesia or Miralax) should be taken according to package directions if there are no bowel movements after 48 hours. 7. Unless discharge instructions indicate otherwise, you may remove your bandages 24-48 hours after surgery, and you may shower at that time.  You may have steri-strips (small skin tapes) in place directly over the incision.  These strips should be left on the skin for 7-10 days.  If your surgeon used skin glue on the incision, you may shower in 24  hours.  The glue will flake off over the next 2-3 weeks.  Any sutures or staples will be removed at the office during your follow-up visit. 8. ACTIVITIES:  You may resume regular daily activities (gradually increasing) beginning the next day.  Wearing a good support bra or sports bra minimizes pain and swelling.  You may have sexual intercourse when it is comfortable. a. You may drive when you no longer are taking prescription pain medication, you can comfortably wear a seatbelt, and you can safely maneuver your car and apply brakes. b. RETURN TO WORK:  ______________________________________________________________________________________ 9. You should see your doctor in the office for a follow-up appointment approximately two weeks after your surgery.  Your doctors nurse will typically make your follow-up appointment when she calls you with your pathology report.  Expect your pathology report 2-3 business days after your surgery.  You may call to check if you do not hear from [place] after three days. 10. OTHER INSTRUCTIONS: _______________________________________________________________________________________________ _____________________________________________________________________________________________________________________________________ _____________________________________________________________________________________________________________________________________ _____________________________________________________________________________________________________________________________________  WHEN TO CALL YOUR DOCTOR: 1. Fever over 101.0 2. Nausea and/or vomiting. 3. Extreme swelling or bruising. 4. Continued bleeding from incision. 5. Increased pain, redness, or drainage from the incision.  The clinic staff is available to answer your questions during regular business hours.  Please dont hesitate to call and ask to speak to one of the nurses for clinical concerns.  If you have a  medical emergency, go to the nearest emergency room or call 911.  A surgeon from [facility] is always on call at the hospital.  For further questions, please visit [email or web address]    Post Anesthesia Home Care Instructions  Activity: Get plenty of rest for the remainder of the day. A responsible individual must stay with you for 24 hours following the procedure.  For the next 24 hours, DO NOT: -Drive a car -Paediatric nurse -Drink alcoholic beverages -Take any medication unless instructed by your physician -Make any legal decisions or sign important papers.  Meals: Start with liquid foods such as gelatin or soup. Progress to regular foods as tolerated. Avoid greasy, spicy, heavy foods. If nausea and/or vomiting occur, drink only clear liquids until the nausea and/or vomiting subsides. Call your physician if vomiting continues.  Special Instructions/Symptoms: Your throat may feel dry or sore from the anesthesia or the breathing tube placed in your throat during surgery. If this causes discomfort, gargle with warm salt water. The discomfort should disappear within 24 hours.  If you had a scopolamine patch placed behind your ear for the management of post- operative nausea and/or vomiting:  1. The medication in the patch is effective for 72 hours, after which it should be removed.  Wrap patch in a tissue and discard in the trash. Wash hands thoroughly with soap and water. 2. You may remove the patch earlier than 72 hours if you experience unpleasant side effects which may include dry mouth, dizziness or visual disturbances. 3. Avoid touching the patch. Wash your hands with soap and water after contact with the patch.

## 2017-04-14 NOTE — Anesthesia Postprocedure Evaluation (Signed)
Anesthesia Post Note  Patient: Cheryl [name]  Procedure(s) Performed: RIGHT BREAST LUMPECTOMY (Right Breast)     Patient location during evaluation: PACU Anesthesia Type: General Level of consciousness: awake and alert Pain management: pain level controlled Vital Signs Assessment: post-procedure vital signs reviewed and stable Respiratory status: spontaneous breathing, nonlabored ventilation, respiratory function stable and patient connected to nasal cannula oxygen Cardiovascular status: blood pressure returned to baseline and stable Postop Assessment: no apparent nausea or vomiting Anesthetic complications: no    Last Vitals:  Vitals:   04/13/17 1545 04/13/17 1602  BP: 123/79 124/83  Pulse: (!) 59 64  Resp: (!) 21 14  Temp:  36.7 C  SpO2: 97% 100%    Last Pain:  Vitals:   04/13/17 1602  TempSrc: Oral  PainSc: 0-No pain                 Josha Weekley S

## 2017-04-15 ENCOUNTER — Encounter: Payer: Self-pay | Admitting: Obstetrics & Gynecology

## 2017-04-15 NOTE — Patient Instructions (Addendum)
1. Encounter for routine gynecological examination with Papanicolaou smear of cervix Normal gynecologic exam.  Pap with high risk HPV done.  Breast exam normal.  Health labs with family physician.  2. Unprotected sexual intercourse Rule out early pregnancy given unprotected intercourse 2 weeks ago.  Gonorrhea and Chlamydia done on Pap test.  Recommend using condoms until ready to attempt conception. - hCG, serum, qualitative  3. Encounter for preconception consultation At the patient's preference, will use natural method for contraception until ready to attempt conception in the spring 2019.  Recommend starting on prenatal vitamins.  4. Goiter Mild diffuse goiter.  Refer to endocrinology for investigation and management.  5. Family history of breast cancer in mother Mother with breast cancer at age 48.  Father with prostate cancer in his 82s.  Ovarian cancer in second-degree relatives.  Half brother (same mother) with colon cancer at age 105, but probably genetic risk on his father's side.  Decision to refer for genetic counseling to perform genetic testing.  Cheryl [name], it was a pleasure meeting you today!  I will inform you of your results as soon as available.  Health Maintenance, Female Adopting a healthy lifestyle and getting preventive care can go a long way to promote health and wellness. Talk with your health care provider about what schedule of regular examinations is right for you. This is a good chance for you to check in with your provider about disease prevention and staying healthy. In between checkups, there are plenty of things you can do on your own. Experts have done a lot of research about which lifestyle changes and preventive measures are most likely to keep you healthy. Ask your health care provider for more information. Weight and diet Eat a healthy diet  Be sure to include plenty of vegetables, fruits, low-fat dairy products, and lean protein.  Do not eat a lot of foods  high in solid fats, added sugars, or salt.  Get regular exercise. This is one of the most important things you can do for your health. ? Most adults should exercise for at least 150 minutes each week. The exercise should increase your heart rate and make you sweat (moderate-intensity exercise). ? Most adults should also do strengthening exercises at least twice a week. This is in addition to the moderate-intensity exercise.  Maintain a healthy weight  Body mass index (BMI) is a measurement that can be used to identify possible weight problems. It estimates body fat based on height and weight. Your health care provider can help determine your BMI and help you achieve or maintain a healthy weight.  For females 73 years of age and older: ? A BMI below 18.5 is considered underweight. ? A BMI of 18.5 to 24.9 is normal. ? A BMI of 25 to 29.9 is considered overweight. ? A BMI of 30 and above is considered obese.  Watch levels of cholesterol and blood lipids  You should start having your blood tested for lipids and cholesterol at 30 years of age, then have this test every 5 years.  You may need to have your cholesterol levels checked more often if: ? Your lipid or cholesterol levels are high. ? You are older than 30 years of age. ? You are at high risk for heart disease.  Cancer screening Lung Cancer  Lung cancer screening is recommended for adults 23-53 years old who are at high risk for lung cancer because of a history of smoking.  A yearly low-dose CT scan of the  lungs is recommended for people who: ? Currently smoke. ? Have quit within the past 15 years. ? Have at least a 30-pack-year history of smoking. A pack year is smoking an average of one pack of cigarettes a day for 1 year.  Yearly screening should continue until it has been 15 years since you quit.  Yearly screening should stop if you develop a health problem that would prevent you from having lung cancer treatment.  Breast  Cancer  Practice breast self-awareness. This means understanding how your breasts normally appear and feel.  It also means doing regular breast self-exams. Let your health care provider know about any changes, no matter how small.  If you are in your 20s or 30s, you should have a clinical breast exam (CBE) by a health care provider every 1-3 years as part of a regular health exam.  If you are 35 or older, have a CBE every year. Also consider having a breast X-ray (mammogram) every year.  If you have a family history of breast cancer, talk to your health care provider about genetic screening.  If you are at high risk for breast cancer, talk to your health care provider about having an MRI and a mammogram every year.  Breast cancer gene (BRCA) assessment is recommended for women who have family members with BRCA-related cancers. BRCA-related cancers include: ? Breast. ? Ovarian. ? Tubal. ? Peritoneal cancers.  Results of the assessment will determine the need for genetic counseling and BRCA1 and BRCA2 testing.  Cervical Cancer Your health care provider may recommend that you be screened regularly for cancer of the pelvic organs (ovaries, uterus, and vagina). This screening involves a pelvic examination, including checking for microscopic changes to the surface of your cervix (Pap test). You may be encouraged to have this screening done every 3 years, beginning at age 49.  For women ages 74-65, health care providers may recommend pelvic exams and Pap testing every 3 years, or they may recommend the Pap and pelvic exam, combined with testing for human papilloma virus (HPV), every 5 years. Some types of HPV increase your risk of cervical cancer. Testing for HPV may also be done on women of any age with unclear Pap test results.  Other health care providers may not recommend any screening for nonpregnant women who are considered low risk for pelvic cancer and who do not have symptoms. Ask your  health care provider if a screening pelvic exam is right for you.  If you have had past treatment for cervical cancer or a condition that could lead to cancer, you need Pap tests and screening for cancer for at least 20 years after your treatment. If Pap tests have been discontinued, your risk factors (such as having a new sexual partner) need to be reassessed to determine if screening should resume. Some women have medical problems that increase the chance of getting cervical cancer. In these cases, your health care provider may recommend more frequent screening and Pap tests.  Colorectal Cancer  This type of cancer can be detected and often prevented.  Routine colorectal cancer screening usually begins at 30 years of age and continues through 30 years of age.  Your health care provider may recommend screening at an earlier age if you have risk factors for colon cancer.  Your health care provider may also recommend using home test kits to check for hidden blood in the stool.  A small camera at the end of a tube can be used to  examine your colon directly (sigmoidoscopy or colonoscopy). This is done to check for the earliest forms of colorectal cancer.  Routine screening usually begins at age 41.  Direct examination of the colon should be repeated every 5-10 years through 30 years of age. However, you may need to be screened more often if early forms of precancerous polyps or small growths are found.  Skin Cancer  Check your skin from head to toe regularly.  Tell your health care provider about any new moles or changes in moles, especially if there is a change in a mole's shape or color.  Also tell your health care provider if you have a mole that is larger than the size of a pencil eraser.  Always use sunscreen. Apply sunscreen liberally and repeatedly throughout the day.  Protect yourself by wearing long sleeves, pants, a wide-brimmed hat, and sunglasses whenever you are  outside.  Heart disease, diabetes, and high blood pressure  High blood pressure causes heart disease and increases the risk of stroke. High blood pressure is more likely to develop in: ? People who have blood pressure in the high end of the normal range (130-139/85-89 mm Hg). ? People who are overweight or obese. ? People who are African American.  If you are 66-46 years of age, have your blood pressure checked every 3-5 years. If you are 72 years of age or older, have your blood pressure checked every year. You should have your blood pressure measured twice-once when you are at a hospital or clinic, and once when you are not at a hospital or clinic. Record the average of the two measurements. To check your blood pressure when you are not at a hospital or clinic, you can use: ? An automated blood pressure machine at a pharmacy. ? A home blood pressure monitor.  If you are between 62 years and 39 years old, ask your health care provider if you should take aspirin to prevent strokes.  Have regular diabetes screenings. This involves taking a blood sample to check your fasting blood sugar level. ? If you are at a normal weight and have a low risk for diabetes, have this test once every three years after 30 years of age. ? If you are overweight and have a high risk for diabetes, consider being tested at a younger age or more often. Preventing infection Hepatitis B  If you have a higher risk for hepatitis B, you should be screened for this virus. You are considered at high risk for hepatitis B if: ? You were born in a country where hepatitis B is common. Ask your health care provider which countries are considered high risk. ? Your parents were born in a high-risk country, and you have not been immunized against hepatitis B (hepatitis B vaccine). ? You have HIV or AIDS. ? You use needles to inject street drugs. ? You live with someone who has hepatitis B. ? You have had sex with someone who has  hepatitis B. ? You get hemodialysis treatment. ? You take certain medicines for conditions, including cancer, organ transplantation, and autoimmune conditions.  Hepatitis C  Blood testing is recommended for: ? Everyone born from [birth year] through 1964. ? Anyone with known risk factors for hepatitis C.  Sexually transmitted infections (STIs)  You should be screened for sexually transmitted infections (STIs) including gonorrhea and chlamydia if: ? You are sexually active and are younger than 30 years of age. ? You are older than 30 years of age and  your health care provider tells you that you are at risk for this type of infection. ? Your sexual activity has changed since you were last screened and you are at an increased risk for chlamydia or gonorrhea. Ask your health care provider if you are at risk.  If you do not have HIV, but are at risk, it may be recommended that you take a prescription medicine daily to prevent HIV infection. This is called pre-exposure prophylaxis (PrEP). You are considered at risk if: ? You are sexually active and do not regularly use condoms or know the HIV status of your partner(s). ? You take drugs by injection. ? You are sexually active with a partner who has HIV.  Talk with your health care provider about whether you are at high risk of being infected with HIV. If you choose to begin PrEP, you should first be tested for HIV. You should then be tested every 3 months for as long as you are taking PrEP. Pregnancy  If you are premenopausal and you may become pregnant, ask your health care provider about preconception counseling.  If you may become pregnant, take 400 to 800 micrograms (mcg) of folic acid every day.  If you want to prevent pregnancy, talk to your health care provider about birth control (contraception). Osteoporosis and menopause  Osteoporosis is a disease in which the bones lose minerals and strength with aging. This can result in serious bone  fractures. Your risk for osteoporosis can be identified using a bone density scan.  If you are 49 years of age or older, or if you are at risk for osteoporosis and fractures, ask your health care provider if you should be screened.  Ask your health care provider whether you should take a calcium or vitamin D supplement to lower your risk for osteoporosis.  Menopause may have certain physical symptoms and risks.  Hormone replacement therapy may reduce some of these symptoms and risks. Talk to your health care provider about whether hormone replacement therapy is right for you. Follow these instructions at home:  Schedule regular health, dental, and eye exams.  Stay current with your immunizations.  Do not use any tobacco products including cigarettes, chewing tobacco, or electronic cigarettes.  If you are pregnant, do not drink alcohol.  If you are breastfeeding, limit how much and how often you drink alcohol.  Limit alcohol intake to no more than 1 drink per day for nonpregnant women. One drink equals 12 ounces of beer, 5 ounces of wine, or 1 ounces of hard liquor.  Do not use street drugs.  Do not share needles.  Ask your health care provider for help if you need support or information about quitting drugs.  Tell your health care provider if you often feel depressed.  Tell your health care provider if you have ever been abused or do not feel safe at home. This information is not intended to replace advice given to you by your health care provider. Make sure you discuss any questions you have with your health care provider. Document Released: 10/24/2010 Document Revised: 09/16/2015 Document Reviewed: 01/12/2015 Elsevier Interactive Patient Education  [employer].

## 2017-04-18 ENCOUNTER — Encounter (HOSPITAL_BASED_OUTPATIENT_CLINIC_OR_DEPARTMENT_OTHER): Payer: Self-pay | Admitting: Surgery

## 2017-04-18 LAB — PAP IG, CT-NG NAA, HPV HIGH-RISK
C. trachomatis RNA, TMA: NOT DETECTED
HPV DNA High Risk: NOT DETECTED
N. gonorrhoeae RNA, TMA: NOT DETECTED

## 2017-04-23 ENCOUNTER — Other Ambulatory Visit: Payer: Self-pay

## 2017-04-23 MED ORDER — VALACYCLOVIR HCL 500 MG PO TABS: 500.0000 mg | ORAL_TABLET | Freq: Every day | ORAL | 12 refills | Status: AC

## 2017-04-23 NOTE — Telephone Encounter (Signed)
1. [facility]  2. 05/28/17 with Dr. Dwyane Dee at [facility]

## 2017-05-24 ENCOUNTER — Inpatient Hospital Stay

## 2017-05-24 ENCOUNTER — Ambulatory Visit: Admitting: Endocrinology

## 2017-05-24 ENCOUNTER — Inpatient Hospital Stay: Attending: Genetic Counselor | Admitting: Genetics

## 2017-05-24 DIAGNOSIS — Z808 Family history of malignant neoplasm of other organs or systems: Secondary | ICD-10-CM

## 2017-05-24 DIAGNOSIS — Z8042 Family history of malignant neoplasm of prostate: Secondary | ICD-10-CM

## 2017-05-24 DIAGNOSIS — Z800 Family history of malignant neoplasm of digestive organs: Secondary | ICD-10-CM | POA: Diagnosis not present

## 2017-05-24 DIAGNOSIS — Z803 Family history of malignant neoplasm of breast: Secondary | ICD-10-CM | POA: Diagnosis not present

## 2017-05-24 DIAGNOSIS — Z8041 Family history of malignant neoplasm of ovary: Secondary | ICD-10-CM | POA: Diagnosis not present

## 2017-05-24 DIAGNOSIS — D219 Benign neoplasm of connective and other soft tissue, unspecified: Secondary | ICD-10-CM | POA: Diagnosis not present

## 2017-05-25 ENCOUNTER — Encounter: Payer: Self-pay | Admitting: Genetics

## 2017-05-25 DIAGNOSIS — Z808 Family history of malignant neoplasm of other organs or systems: Secondary | ICD-10-CM | POA: Insufficient documentation

## 2017-05-25 DIAGNOSIS — D219 Benign neoplasm of connective and other soft tissue, unspecified: Secondary | ICD-10-CM

## 2017-05-25 DIAGNOSIS — Z8042 Family history of malignant neoplasm of prostate: Secondary | ICD-10-CM | POA: Insufficient documentation

## 2017-05-25 HISTORY — DX: Benign neoplasm of connective and other soft tissue, unspecified: D21.9

## 2017-05-25 NOTE — Progress Notes (Signed)
REFERRING PROVIDER: Princess Bruins, [address], [place] [address]  PRIMARY PROVIDER:  Helane Rima, MD  PRIMARY REASON FOR VISIT:  1. Family history of breast cancer   2. Family history of brain cancer   3. Family history of prostate cancer   4. Granular cell tumor   5. Family history of ovarian cancer   6. Family history of colon cancer    HISTORY OF PRESENT ILLNESS:   Cheryl [name], a 32 y.o. female, was seen for a [place] cancer genetics consultation at the request of Dr. Dellis Filbert due to a family history of cancer.  Cheryl [name] presents to clinic today to discuss the possibility of a hereditary predisposition to cancer, genetic testing, and to further clarify her future cancer risks, as well as potential cancer risks for family members.   Cheryl [name] is a 32 y.o. female with no personal history of cancer.    HORMONAL RISK FACTORS:  Menarche was at age 32.  First live birth at age N/A.  OCP use for approximately have used in past, not currently using years.  Ovaries intact: yes.  Hysterectomy: no.  Menopausal status: premenopausal.  HRT use: 0 years. Colonoscopy: no; not examined. Mammogram within the last year: yes. Number of breast biopsies: 1.  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   Past Medical History:  Diagnosis Date  . Breast mass, right 03/2017  . Family history of brain cancer   . Family history of breast cancer   . Family history of colon cancer   . Family history of prostate cancer   . GERD (gastroesophageal reflux disease)    no current med.  . 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 HSV-2 infection     Past Surgical History:  Procedure Laterality Date  . BREAST LUMPECTOMY Right 04/13/2017   Procedure: RIGHT BREAST LUMPECTOMY;  Surgeon: Coralie Keens, MD;  Location: [facility];  Service: General;  Laterality: Right;  . NO PAST SURGERIES      Social History   Socioeconomic History  . Marital  status: Single    Spouse name: None  . Number of children: None  . Years of education: None  . Highest education level: None  Social Needs  . Financial resource strain: None  . Food insecurity - worry: None  . Food insecurity - inability: None  . Transportation needs - medical: None  . Transportation needs - non-medical: None  Occupational History  . None  Tobacco Use  . Smoking status: Never Smoker  . Smokeless tobacco: Never Used  Substance and Sexual Activity  . Alcohol use: Yes    Comment: occasionally  . Drug use: No  . Sexual activity: Yes    Partners: Male    Comment: 1st intercourse- 39, partners- 70, current partner- 6 yrs   Other Topics Concern  . None  Social History Narrative  . None     FAMILY HISTORY:  We obtained a detailed, 4-generation family history.  Significant diagnoses are listed below: Family History  Adopted: Yes  Problem Relation Age of Onset  . Breast cancer Mother 43  . Brain cancer Maternal Aunt 45  . Cancer - Lung Maternal Grandmother 50  . Cancer - Ovarian Cousin 40  . Prostate cancer Father        dx in his 19s- has a colectomy bag so she is unsure if maybe it was actually colon cancer?  . Colon cancer Brother 4  Stage IV  . Heart attack Maternal Uncle 76   Cheryl [name] has no children.  She has a fraternal twin sister with no history of cancer.  Cheryl [name] has 2 paternal half sisters with no history of cancer.  She has a paternal half-brother who died at 31 with no cancer and another paternal half-brother who has no history of cancer. Cheryl [name] has a maternal half-brother who was recently diagnosed with colon cancer at 15.  She is unsure if he had genetic testing, but reports his father (not biologically related to patient) had an abnormal gene identified.  This half-brother has 4 sons with no history of cancer.  [name] has a maternal half-sister with no history of cancer.   Cheryl [name] father has prostate cancer diagnosed  in his 46's.  However she reports that he has a colectomy bag and had a rectum surgery so she wonders if this could actually be colon cancer.  Cheryl [name] has 6 paternal uncles and 4 paternal aunts with no history of cancer.  She has several paternal cousins, none with any history of cancer.  Cheryl [name] does not know what her grandparents died from but knows they lived into their 77's.   Cheryl [name]. Creedon mother was diagnosed with breast cancer at 2.  Her mother is now 80.  Cheryl [name] has a maternal aunt who died of brain cancer in her 80's.  This aunt had 2 sons and 2 daughters in their 55's/40's.  Cheryl [name] maternal grandfather died at 33 due to a heart attack.  He had 2 sons and 2 daughters.  One daughter (patient's cousin) died of ovarian cancer in her 5's/40's.  Cheryl [name] maternal grandfather died young due to trauma, and her maternal grandmother died in her 64's due to lung cancer.    Patient's maternal ancestors are of African American descent, and paternal ancestors are of African American descent- she reports there is also Scottish/Irish? And Cherokee ancestry in her family.  There is no reported Ashkenazi Jewish ancestry. There is no known consanguinity.  GENETIC COUNSELING ASSESSMENT: Cheryl [name] is a 32 y.o. female with a family history which is somewhat suggestive of a Hereditary Cancer Predisposition Syndrome. We, therefore, discussed and recommended the following at today's visit.   DISCUSSION: We reviewed the characteristics, features and inheritance patterns of hereditary cancer syndromes. We also discussed genetic testing, including the appropriate family members to test, the process of testing, insurance coverage and turn-around-time for results. We discussed the implications of a negative, positive and/or variant of uncertain significant result. We recommended Cheryl [name] pursue genetic testing for the Common Hereditary Cancer gene panel.  APC, ATM, AXIN2, BARD1, BMPR1A,  BRCA1, BRCA2, BRIP1, CDH1, CDK4, CDKN2A (p14ARF), CDKN2A (p16INK4a), CHEK2, CTNNA1, DICER1, EPCAM, GREM1, KIT, MEN1, MLH1, MSH2, MSH3, MSH6, MUTYH, NBN, NF1, NHTL1, PALB2, PDGFRA, PMS2, POLD1, POLE, PTEN, RAD50, RAD51C, RAD51D, SDHB, SDHC, SDHD, SMAD4, SMARCA4. STK11, TP53, TSC1, TSC2, VHL, SDHA, HOXB13   We discussed that only 5-10% of cancers are associated with a Hereditary cancer predisposition syndrome.  One of the most common hereditary cancer syndromes that increases breast cancer risk is called Hereditary Breast and Ovarian Cancer (HBOC) syndrome.  This syndrome is caused by mutations in the BRCA1 and BRCA2 genes.  This syndrome increases an individual's lifetime risk to develop breast, ovarian, pancreatic, and other types of cancer.  There are also many other cancer predisposition syndromes caused by mutations in several other genes.  We discussed that if she is found  to have a mutation in one of these genes, it may impact future medical management recommendations such as increased cancer screenings and consideration of risk reducing surgeries.  A positive result could also have implications for the patient's family members.  A Negative result would mean we were unable to identify a hereditary predisposition to cancer, but does not rule out the possibility of a hereditary basis for her family's cancer.  There could be mutations that are undetectable by current technology, or in genes not yet tested or identified to increase cancer risk.    We discussed the potential to find a Variant of Uncertain Significance or VUS.  These are variants that have not yet been identified as pathogenic or benign, and it is unknown if this variant is associated with increased cancer risk or if this is a normal finding.  Most VUS's are reclassified to benign or likely benign.   It should not be used to make medical management decisions. With time, we suspect the lab will determine the significance of any VUS's  identified if any.   Based on Cheryl [name]'s family history of cancer, she meets medical criteria for genetic testing. Despite that she meets criteria, she may still have an out of pocket cost. We discussed that if her out of pocket cost for testing is over $100, the laboratory will call and confirm whether she wants to proceed with testing.  If the out of pocket cost of testing is less than $100 she will be billed by the genetic testing laboratory.   Based on the patient's personal and family history, the statistical model (Tyrer Cusik)   Was used to estimate her risk of developing breast cancer. This estimates her lifetime risk of developing breast cancer to be approximately 23.2%. This estimation does not take into account any genetic testing results.  The patient's lifetime breast cancer risk is a preliminary estimate based on available information using one of several models endorsed by the [place] (ACS). The ACS recommends consideration of breast MRI screening as an adjunct to mammography for patients at high risk (defined as 20% or greater lifetime risk). A more detailed breast cancer risk assessment can be considered, if clinically indicated.   Cheryl [name] has been determined to be at high risk for breast cancer.  Therefore, we recommend that annual screening with mammography and breast MRI begin at age 9, or 10 years prior to the age of breast cancer diagnosis in a relative (whichever is earlier).  We discussed that Cheryl [name] should discuss her individual situation with her referring physician and determine a breast cancer screening plan with which they are both comfortable.     PLAN: After considering the risks, benefits, and limitations, Cheryl [name]  provided informed consent to pursue genetic testing and the blood sample was sent to [facility] for analysis of the Common Hereditary Cancer Panel. Results should be available within approximately 2-3 weeks' time, at which  point they will be disclosed by telephone to Cheryl [name]. Achey, as will any additional recommendations warranted by these results. Cheryl [name]. Virella will receive a summary of her genetic counseling visit and a copy of her results once available. This information will also be available in Epic. We encouraged Cheryl [name]. Kincer to remain in contact with cancer genetics annually so that we can continuously update the family history and inform her of any changes in cancer genetics and testing that may be of benefit for her family. Cheryl [name]. Fomby questions were answered to her satisfaction  today. Our contact information was provided should additional questions or concerns arise.  Based on Cheryl [name]. Hsu's family history, we recommended her maternal relatives, have genetic counseling and testing. Cheryl [name]. Daddona will let us know if we can be of any assistance in coordinating genetic counseling and/or testing for this family member.   Lastly, we encouraged Cheryl [name]. Mceachin to remain in contact with cancer genetics annually so that we can continuously update the family history and inform her of any changes in cancer genetics and testing that may be of benefit for this family.   Cheryl [name].  Prowell questions were answered to her satisfaction today. Our contact information was provided should additional questions or concerns arise. Thank you for the referral and allowing [place] to share in the care of your patient.   Tana Felts, Cheryl [name] Genetic Counselor [email or web address] phone: [phone]  The patient was seen for a total of 35 minutes in face-to-face genetic counseling.

## 2017-05-28 ENCOUNTER — Ambulatory Visit: Admitting: Endocrinology

## 2017-06-01 ENCOUNTER — Telehealth: Payer: Self-pay | Admitting: Genetics

## 2017-06-01 NOTE — Telephone Encounter (Signed)
Told patient the laboratory for genetic testing needs her insurance ID number.  The care we have in our system does not have her ID number on it.  Told her she could call with this information or email it to me.

## 2017-06-01 NOTE — Telephone Encounter (Signed)
Patient called and informed me that her insurance number that is needed to bill her insurance is her SSN.  I provided her the contact information for the laboratory so she can tell them this number directly as this is likely the most secure way for them to get this information.  She said she would call to provide this to them.

## 2017-06-18 ENCOUNTER — Telehealth: Payer: Self-pay | Admitting: Genetics

## 2017-06-18 NOTE — Telephone Encounter (Signed)
Revealed negative genetic testing.  Revealed that a VUS in PMS2 was identified.   This normal result is reassuring and indicates that it is has an increased risk for cancer due to a mutation in one of these genes.   However, genetic testing is not perfect, and cannot definitively rule out a hereditary cause.  It will be important for her to keep in contact with genetics to learn if any additional testing may be needed in the future.     Discussed that her Lilian Kapur risk estimate is about 23% and we recommend she discuss high risk breast screening with Dr. Dellis Filbert, her OBGYN as this is recommended in women with a lifetime risk >20%.  We recommended other maternal relatives have genetic testing as well given the family history.    Updated her address as she recently  Moved.

## 2017-06-27 ENCOUNTER — Ambulatory Visit: Payer: Self-pay | Admitting: Genetics

## 2017-06-27 ENCOUNTER — Encounter: Payer: Self-pay | Admitting: Genetics

## 2017-06-27 DIAGNOSIS — Z808 Family history of malignant neoplasm of other organs or systems: Secondary | ICD-10-CM

## 2017-06-27 DIAGNOSIS — Z800 Family history of malignant neoplasm of digestive organs: Secondary | ICD-10-CM

## 2017-06-27 DIAGNOSIS — Z1379 Encounter for other screening for genetic and chromosomal anomalies: Secondary | ICD-10-CM

## 2017-06-27 DIAGNOSIS — Z803 Family history of malignant neoplasm of breast: Secondary | ICD-10-CM

## 2017-06-27 DIAGNOSIS — Z8041 Family history of malignant neoplasm of ovary: Secondary | ICD-10-CM

## 2017-06-27 DIAGNOSIS — Z8042 Family history of malignant neoplasm of prostate: Secondary | ICD-10-CM

## 2017-06-27 NOTE — Progress Notes (Signed)
HPI: Ms. Perdomo was previously seen in the [facility] on 05/24/2017 due to a family history of cancer and concerns regarding a hereditary predisposition to cancer. Please refer to our prior cancer genetics clinic note for more information regarding Ms. Yoshino's medical, social and family histories, and our assessment and recommendations, at the time. Ms. Odonell recent genetic test results were disclosed to her, as well as recommendations warranted by these results. These results and recommendations are discussed in more detail below.  FAMILY HISTORY:  We obtained a detailed, 4-generation family history.  Significant diagnoses are listed below: Family History  Adopted: Yes  Problem Relation Age of Onset  . Breast cancer Mother 41  . Brain cancer Maternal Aunt 45  . Cancer - Lung Maternal Grandmother 10  . Cancer - Ovarian Cousin 40  . Prostate cancer Father        dx in his 11s- has a colectomy bag so she is unsure if maybe it was actually colon cancer?  . Colon cancer Brother 82       Stage IV  . Heart attack Maternal Uncle 46    Ms. Crocker has no children.  She has a fraternal twin sister with no history of cancer.  Ms. Lemaire has 2 paternal half sisters with no history of cancer.  She has a paternal half-brother who died at 39 with no cancer and another paternal half-brother who has no history of cancer. Ms. Snedeker has a maternal half-brother who was recently diagnosed with colon cancer at 64.  She is unsure if he had genetic testing, but reports his father (not biologically related to patient) had an abnormal gene identified.  This half-brother has 4 sons with no history of cancer.  Mr. Gu has a maternal half-sister with no history of cancer.   Ms. Skelley father has prostate cancer diagnosed in his 15's.  However she reports that he has a colectomy bag and had a rectum surgery so she wonders if this could actually be colon cancer.  Ms. Brisby has 6  paternal uncles and 4 paternal aunts with no history of cancer.  She has several paternal cousins, none with any history of cancer.  Ms. Asbill does not know what her grandparents died from but knows they lived into their 43's.   Ms. Sebastiani mother was diagnosed with breast cancer at 17.  Her mother is now 1.  Ms. Tangeman has a maternal aunt who died of brain cancer in her 54's.  This aunt had 2 sons and 2 daughters in their 76's/40's.  Ms. Janeway maternal grandfather died at 66 due to a heart attack.  He had 2 sons and 2 daughters.  One daughter (patient's cousin) died of ovarian cancer in her 106's/40's.  Ms. Locher maternal grandfather died young due to trauma, and her maternal grandmother died in her 55's due to lung cancer.    Patient's maternal ancestors are of African American descent, and paternal ancestors are of African American descent- she reports there is also Scottish/Irish? And Cherokee ancestry in her family.  There is no reported Ashkenazi Jewish ancestry. There is no known consanguinity.  GENETIC TEST RESULTS: Genetic testing performed through [facility]'s Common Hereditary Cancers Panel reported out on 06/06/2017 showed no pathogenic mutations. The Common Hereditary Cancer Panel offered by [facility] includes sequencing and/or deletion duplication testing of the following 47 genes: APC, ATM, AXIN2, BARD1, BMPR1A, BRCA1, BRCA2, BRIP1, CDH1, CDKN2A (p14ARF), CDKN2A (p16INK4a), CKD4, CHEK2, CTNNA1, DICER1, EPCAM (Deletion/duplication testing only),  GREM1 (promoter region deletion/duplication testing only), KIT, MEN1, MLH1, MSH2, MSH3, MSH6, MUTYH, NBN, NF1, NHTL1, PALB2, PDGFRA, PMS2, POLD1, POLE, PTEN, RAD50, RAD51C, RAD51D, SDHB, SDHC, SDHD, SMAD4, SMARCA4. STK11, TP53, TSC1, TSC2, and VHL.  The following genes were evaluated for sequence changes only: SDHA and HOXB13 c.251G>A variant only..  A variant of uncertain significance (VUS) in a gene called PMS2 was also noted. c.2108C>T  (p.Thr703Met)  The test report will be scanned into EPIC and will be located under the Molecular Pathology section of the Results Review tab.A portion of the result report is included below for reference.     We discussed with Ms. Blacksher that because current genetic testing is not perfect, it is possible there may be a gene mutation in one of these genes that current testing cannot detect, but that chance is small. We also discussed, that there could be another gene that has not yet been discovered, or that we have not yet tested, that is responsible for the cancer diagnoses in the family. It is also possible there is a hereditary cause for the cancer in the family that Ms. Lillo did not inherit and therefore was not identified in her testing.  Therefore, it is important to remain in touch with cancer genetics in the future so that we can continue to offer Ms. Victorino the most up to date genetic testing.   Regarding the VUS in PMS2: At this time, it is unknown if this variant is associated with increased cancer risk or if this is a normal finding, but most variants such as this get reclassified to being inconsequential. It should not be used to make medical management decisions. With time, we suspect the lab will determine the significance of this variant, if any. If we do learn more about it, we will try to contact Ms. Sanpedro to discuss it further. However, it is important to stay in touch with [place] periodically and keep the address and phone number up to date.  ADDITIONAL GENETIC TESTING: We discussed with Ms. Hickam that there are other genes that are associated with increased cancer risk that can be analyzed. The laboratories that offer this testing look at these additional genes via a hereditary cancer gene panel. Should Ms. Shuttleworth wish to pursue additional genetic testing, we are happy to discuss and coordinate this testing, at any time.    CANCER SCREENING RECOMMENDATIONS: Ms. Rosen  test result is considered negative (normal).  This means that we have not identified a hereditary cause for her family history of cancer at this time.   While reassuring, this does not definitively rule out a hereditary predisposition to cancer. It is still possible that there could be genetic mutations that are undetectable by current technology, or genetic mutations in genes that have not been tested or identified to increase cancer risk.  It is also possible there is a hereditary cause for her family's cancer that Ms. Montas did not inherit and therefore was not identified in her genetic testing.    Therefore, Ms. Lovins was advised to continue following the cancer screening guidelines provided by her primary healthcare providers. Other factors such as her personal and family history may still affect her cancer risk.    Based on the patient's personal and family history, the statistical model (Tyrer Cusik)   Was used to estimate her risk of developing breast cancer. This estimates her lifetime risk of developing breast cancer to be approximately 23.2%. This estimation does not take into account any genetic  testing results.  The patient's lifetime breast cancer risk is a preliminary estimate based on available information using one of several models endorsed by the [place] (ACS). The ACS recommends consideration of breast MRI screening as an adjunct to mammography for patients at high risk (defined as 20% or greater lifetime risk). A more detailed breast cancer risk assessment can be considered, if clinically indicated.   Ms. Dawe has been determined to be at high risk for breast cancer. Therefore, we recommend that annual screening with mammography and breast MRI begin at age 5, or 10 years prior to the age of breast cancer diagnosis in a relative (whichever is earlier). We discussed that Ms. Portee should discuss her individual situation with her referring physician and  determine a breast cancer screening plan with which they are both comfortable.      RECOMMENDATIONS FOR FAMILY MEMBERS: Women in this family might be at some increased risk of developing cancer, over the general population risk, simply due to the family history of cancer. We recommended women in this family have a yearly mammogram beginning at age 4, or 30 years younger than the earliest onset of cancer, an annual clinical breast exam, and perform monthly breast self-exams. Women in this family should also have a gynecological exam as recommended by their primary provider. All family members should have a colonoscopy by age 29.  All family members should inform their physicians about the family history of cancer so their doctors can make the most appropriate screening recommendations for them.   Based on Ms. Pingleton's family history, we recommended Ms. Bailey's maternal relatives, have genetic counseling and testing. Ms. Storie will let us know if we can be of any assistance in coordinating genetic counseling and/or testing for these family members.   FOLLOW-UP: Lastly, we discussed with Ms. Mascaro that cancer genetics is a rapidly advancing field and it is possible that new genetic tests will be appropriate for her and/or her family members in the future. We encouraged her to remain in contact with cancer genetics on an annual basis so we can update her personal and family histories and let her know of advances in cancer genetics that may benefit this family.   Our contact number was provided. Ms. Hammock questions were answered to her satisfaction, and she knows she is welcome to call us at anytime with additional questions or concerns.   Ferol Luz, MS, [facility] Certified Genetic Counselor [email or web address]

## 2017-12-22 IMAGING — DX DG KNEE COMPLETE 4+V*L*
4 series · 4 of 4 positions shown · non-contrast
Comparison: None.

CLINICAL DATA: Fall, injury to the knee

EXAM:
LEFT KNEE - COMPLETE 4+ VIEW

[knee ap]
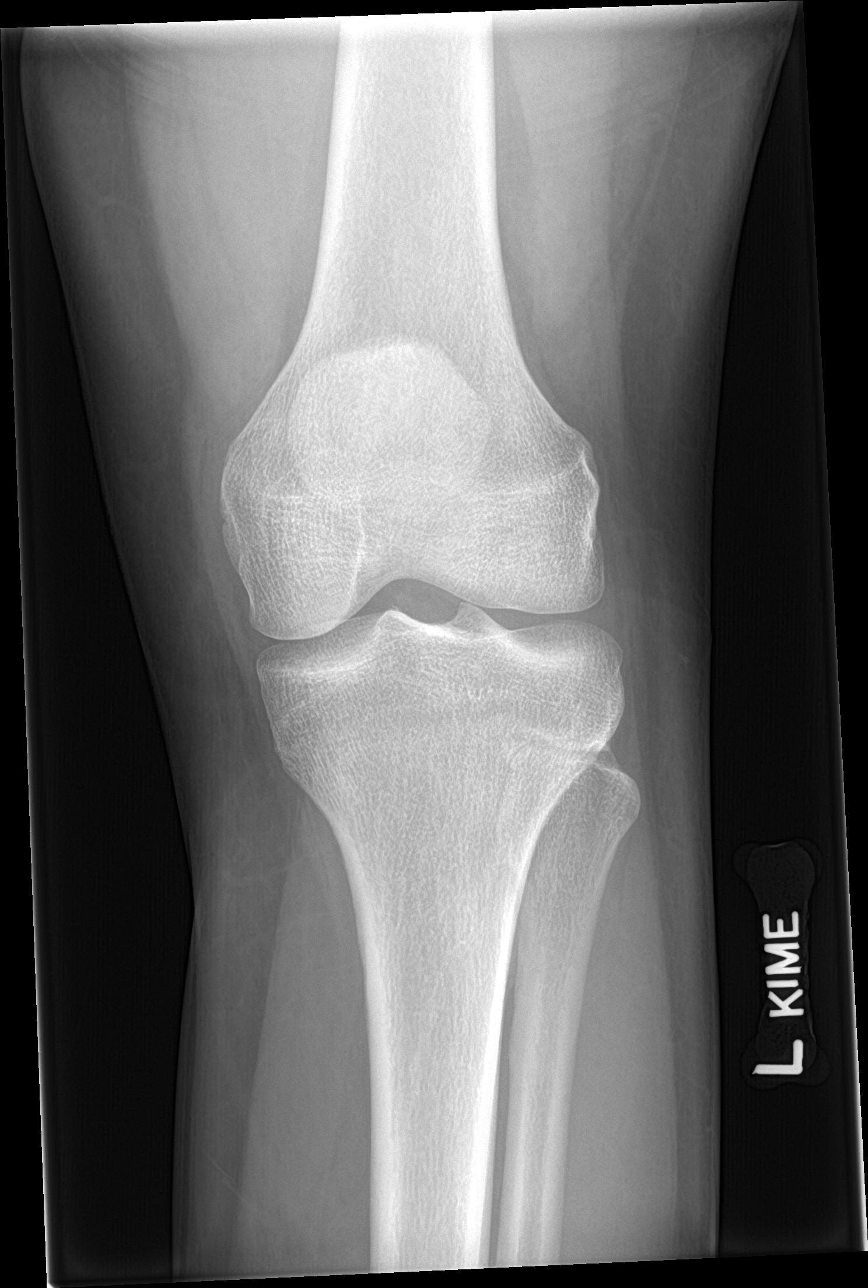

[knee lat]
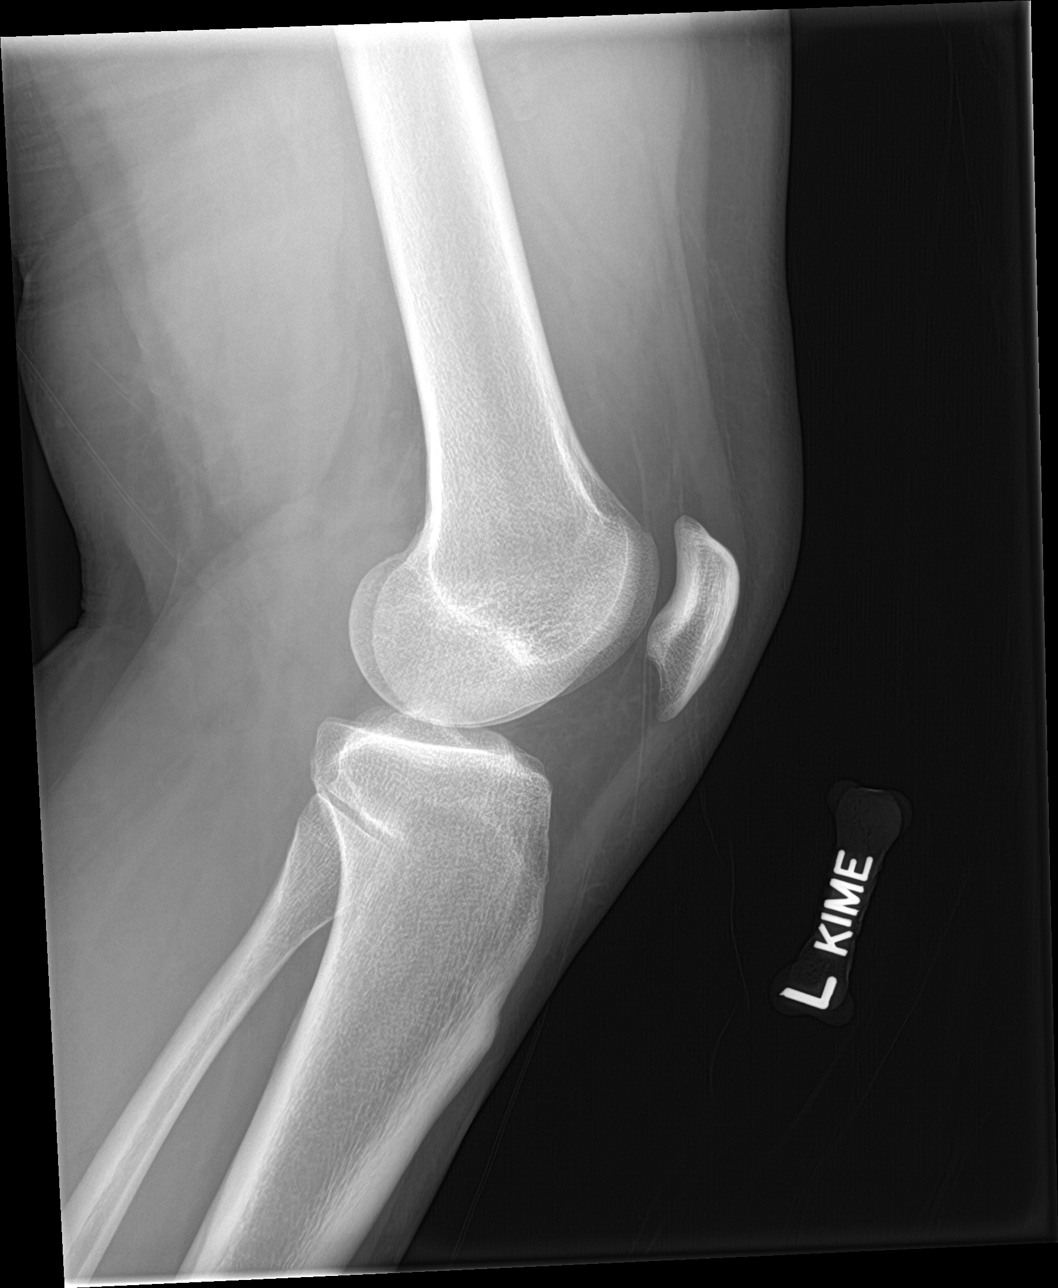

[knee obl (1 of 2)]
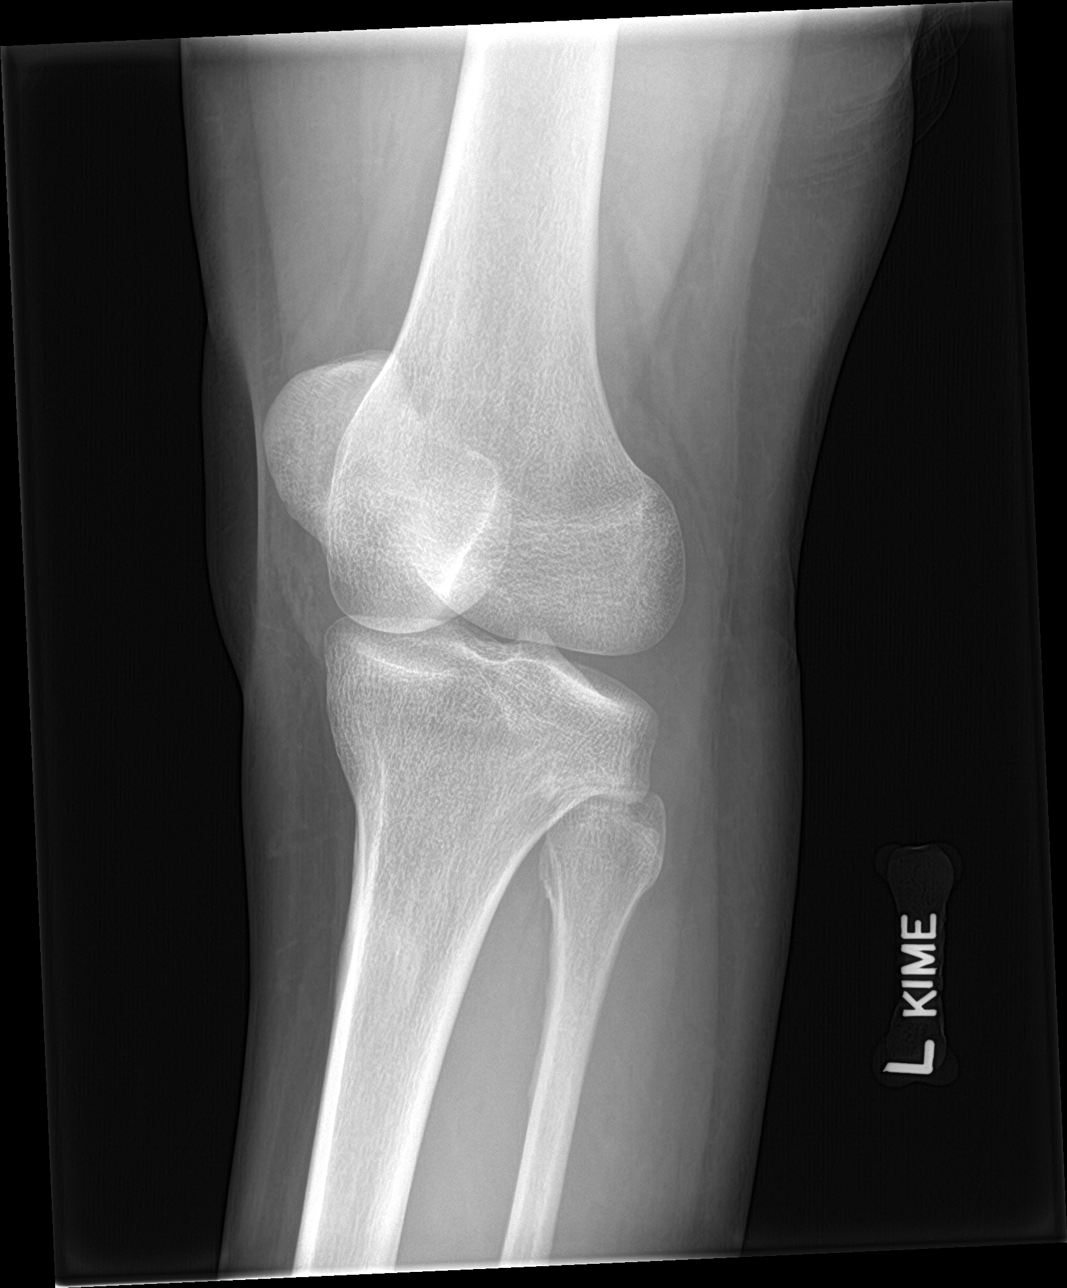

[knee obl (2 of 2)]
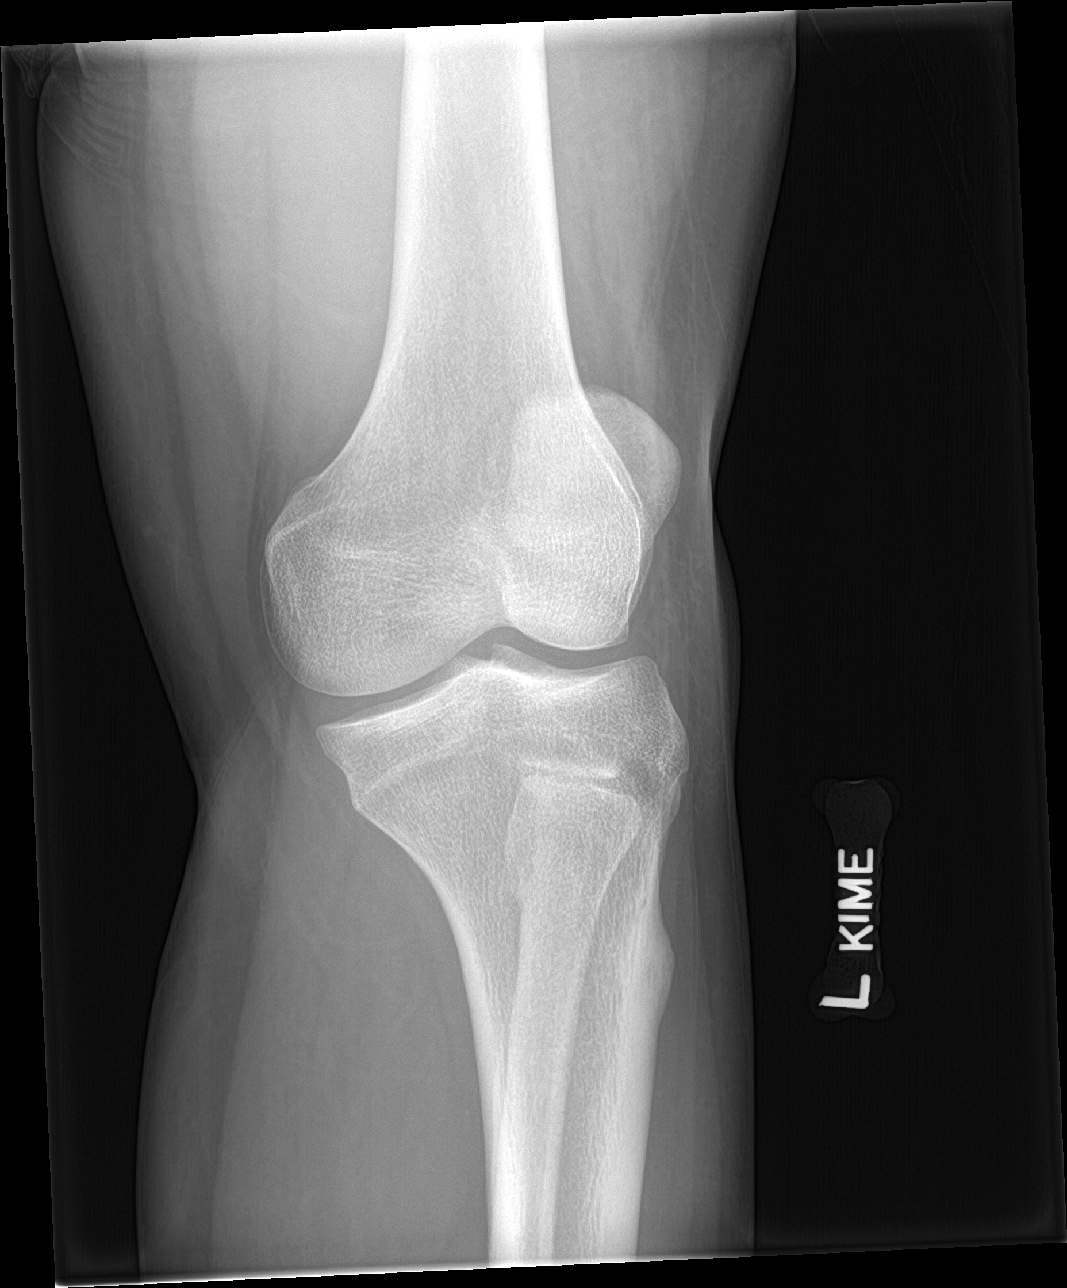

[4 of 4 positions shown; findings below may reference images not displayed]

FINDINGS: No evidence of fracture, dislocation, or joint effusion. No evidence
of arthropathy or other focal bone abnormality. Soft tissues are
unremarkable.
IMPRESSION: Negative.

## 2018-05-07 ENCOUNTER — Other Ambulatory Visit: Payer: Self-pay | Admitting: Gynecology

## 2019-01-13 ENCOUNTER — Encounter: Payer: Self-pay | Admitting: Gynecology
